# Patient Record
Sex: Female | Born: 1980 | Race: White | Hispanic: No | Marital: Single | Smoking: Former smoker
Health system: Southern US, Community
[De-identification: ages and names within clinical notes are randomized; demographics above are authoritative.]

## PROBLEM LIST (undated history)

## (undated) DIAGNOSIS — R569 Unspecified convulsions: Secondary | ICD-10-CM

## (undated) DIAGNOSIS — F419 Anxiety disorder, unspecified: Secondary | ICD-10-CM

## (undated) DIAGNOSIS — S069X9A Unspecified intracranial injury with loss of consciousness of unspecified duration, initial encounter: Secondary | ICD-10-CM

## (undated) HISTORY — PX: APPENDECTOMY: SHX54

## (undated) HISTORY — PX: ESOPHAGOGASTRODUODENOSCOPY ENDOSCOPY: SHX5814

## (undated) HISTORY — PX: CHOLECYSTECTOMY: SHX55

## (undated) HISTORY — PX: DILATION AND CURETTAGE OF UTERUS: SHX78

---

## 2006-03-07 DIAGNOSIS — S069XAA Unspecified intracranial injury with loss of consciousness status unknown, initial encounter: Secondary | ICD-10-CM

## 2006-03-07 DIAGNOSIS — S069X9A Unspecified intracranial injury with loss of consciousness of unspecified duration, initial encounter: Secondary | ICD-10-CM

## 2006-03-07 HISTORY — DX: Unspecified intracranial injury with loss of consciousness status unknown, initial encounter: S06.9XAA

## 2006-03-07 HISTORY — DX: Unspecified intracranial injury with loss of consciousness of unspecified duration, initial encounter: S06.9X9A

## 2017-02-23 DIAGNOSIS — R200 Anesthesia of skin: Secondary | ICD-10-CM

## 2017-02-23 DIAGNOSIS — G43919 Migraine, unspecified, intractable, without status migrainosus: Secondary | ICD-10-CM | POA: Diagnosis not present

## 2017-02-23 DIAGNOSIS — D72829 Elevated white blood cell count, unspecified: Secondary | ICD-10-CM | POA: Diagnosis not present

## 2017-02-23 DIAGNOSIS — R531 Weakness: Secondary | ICD-10-CM

## 2017-02-24 DIAGNOSIS — R531 Weakness: Secondary | ICD-10-CM | POA: Diagnosis not present

## 2017-02-24 DIAGNOSIS — R200 Anesthesia of skin: Secondary | ICD-10-CM | POA: Diagnosis not present

## 2017-02-24 DIAGNOSIS — D72829 Elevated white blood cell count, unspecified: Secondary | ICD-10-CM | POA: Diagnosis not present

## 2017-02-24 DIAGNOSIS — G43919 Migraine, unspecified, intractable, without status migrainosus: Secondary | ICD-10-CM | POA: Diagnosis not present

## 2017-02-24 DIAGNOSIS — R51 Headache: Secondary | ICD-10-CM | POA: Diagnosis not present

## 2017-02-25 DIAGNOSIS — R200 Anesthesia of skin: Secondary | ICD-10-CM | POA: Diagnosis not present

## 2017-02-25 DIAGNOSIS — R531 Weakness: Secondary | ICD-10-CM | POA: Diagnosis not present

## 2017-02-25 DIAGNOSIS — D72829 Elevated white blood cell count, unspecified: Secondary | ICD-10-CM | POA: Diagnosis not present

## 2017-02-25 DIAGNOSIS — G43919 Migraine, unspecified, intractable, without status migrainosus: Secondary | ICD-10-CM | POA: Diagnosis not present

## 2017-03-02 ENCOUNTER — Other Ambulatory Visit: Payer: Self-pay

## 2017-03-02 ENCOUNTER — Encounter (HOSPITAL_COMMUNITY): Payer: Self-pay | Admitting: Emergency Medicine

## 2017-03-02 ENCOUNTER — Emergency Department (HOSPITAL_COMMUNITY)
Admission: EM | Admit: 2017-03-02 | Discharge: 2017-03-02 | Disposition: A | Discharge status: Home / Self Care | Payer: Medicaid Other | Attending: Internal Medicine | Admitting: Internal Medicine

## 2017-03-02 DIAGNOSIS — R51 Headache: Secondary | ICD-10-CM | POA: Insufficient documentation

## 2017-03-02 DIAGNOSIS — G459 Transient cerebral ischemic attack, unspecified: Secondary | ICD-10-CM | POA: Diagnosis present

## 2017-03-02 DIAGNOSIS — R569 Unspecified convulsions: Secondary | ICD-10-CM | POA: Diagnosis present

## 2017-03-02 DIAGNOSIS — R42 Dizziness and giddiness: Secondary | ICD-10-CM | POA: Insufficient documentation

## 2017-03-02 DIAGNOSIS — R519 Headache, unspecified: Secondary | ICD-10-CM

## 2017-03-02 HISTORY — DX: Unspecified convulsions: R56.9

## 2017-03-02 HISTORY — DX: Unspecified intracranial injury with loss of consciousness of unspecified duration, initial encounter: S06.9X9A

## 2017-03-02 HISTORY — DX: Anxiety disorder, unspecified: F41.9

## 2017-03-02 LAB — CBC WITH DIFFERENTIAL/PLATELET
BASOS ABS: 0.1 10*3/uL (ref 0.0–0.1)
BASOS PCT: 1 %
EOS PCT: 1 %
Eosinophils Absolute: 0.2 10*3/uL (ref 0.0–0.7)
HCT: 43.1 % (ref 36.0–46.0)
Hemoglobin: 14.4 g/dL (ref 12.0–15.0)
Lymphocytes Relative: 17 %
Lymphs Abs: 3 10*3/uL (ref 0.7–4.0)
MCH: 28.2 pg (ref 26.0–34.0)
MCHC: 33.4 g/dL (ref 30.0–36.0)
MCV: 84.5 fL (ref 78.0–100.0)
MONO ABS: 0.8 10*3/uL (ref 0.1–1.0)
Monocytes Relative: 4 %
Neutro Abs: 13.8 10*3/uL — ABNORMAL HIGH (ref 1.7–7.7)
Neutrophils Relative %: 77 %
PLATELETS: 527 10*3/uL — AB (ref 150–400)
RBC: 5.1 MIL/uL (ref 3.87–5.11)
RDW: 14.7 % (ref 11.5–15.5)
WBC: 18 10*3/uL — AB (ref 4.0–10.5)

## 2017-03-02 LAB — BASIC METABOLIC PANEL
ANION GAP: 13 (ref 5–15)
BUN: 8 mg/dL (ref 6–20)
CALCIUM: 9.4 mg/dL (ref 8.9–10.3)
CO2: 24 mmol/L (ref 22–32)
Chloride: 102 mmol/L (ref 101–111)
Creatinine, Ser: 0.84 mg/dL (ref 0.44–1.00)
GFR calc Af Amer: >60 mL/min (ref >60)
GLUCOSE: 88 mg/dL (ref 65–99)
POTASSIUM: 4 mmol/L (ref 3.5–5.1)
SODIUM: 139 mmol/L (ref 135–145)

## 2017-03-02 LAB — I-STAT BETA HCG BLOOD, ED (MC, WL, AP ONLY)

## 2017-03-02 LAB — CBG MONITORING, ED: GLUCOSE-CAPILLARY: 89 mg/dL (ref 65–99)

## 2017-03-02 MED ORDER — MECLIZINE HCL 25 MG PO TABS: 25.0000 mg | ORAL_TABLET | Freq: Three times a day (TID) | ORAL | 0 refills | Status: DC | PRN

## 2017-03-02 MED ORDER — KETOROLAC TROMETHAMINE 15 MG/ML IJ SOLN
15.0000 mg | Freq: Once | INTRAMUSCULAR | Status: AC
  Administered 2017-03-02: 15 mg via INTRAVENOUS
  Filled 2017-03-02: qty 1

## 2017-03-02 MED ORDER — METOCLOPRAMIDE HCL 5 MG/ML IJ SOLN
10.0000 mg | Freq: Once | INTRAMUSCULAR | Status: AC
  Administered 2017-03-02: 10 mg via INTRAVENOUS
  Filled 2017-03-02: qty 2

## 2017-03-02 NOTE — Discharge Instructions (Signed)
You were seen in the emergency department due to concern for seizure activity at your primary care office.  We checked some basic lab work, your labs were consistent with your lab work done during your hospital stay last week.  We treated your headache and dizziness with Toradol, this is an anti-inflammatory medicine, and Reglan, this is an anti-nausea medicine.   I have prescribed you Meclizine to treat your dizziness. You may take this as needed every 8 hours.   Take Tylenol for you headache, I also recommend you apply heat and topical lidocaine patches to the right side of your neck. Ask your pharmacist where these lidocaine patches are.  I suspect that your headache may be related to some tension in the muscles on the right side of her neck.  These treatments will help to relax these muscles.  I have prescribed you meclizine for your dizziness.   Call your primary care provider office today to schedule a follow up appointment for tomorrow or Monday.  Return to the emergency department for new or worsening symptoms including, but not limited to seizure, loss of consciousness, change in your vision, numbness, weakness, or inability to move your neck.

## 2017-03-02 NOTE — ED Triage Notes (Signed)
Pt in via [facility] with possible seizures. Per EMS, pt was recently hospitalized at [facility] for seizures and TIA. Went to PCP today for check-up and had 3 "psuedoseizures" per EMS. No incontinence, no LOC, pt remained seated entire time. Arrives nauseous, denies pain. Hx of TBI, was recently started on Keppra after [facility]pt[facility] d/c

## 2017-03-02 NOTE — ED Notes (Signed)
Pt ambulated with one assist to bathroom - unsteady gait

## 2017-03-02 NOTE — ED Provider Notes (Signed)
[facility] EMERGENCY DEPARTMENT Provider Note   CSN: [id number] Arrival date & time: 03/02/17  1249     History   Chief Complaint Chief Complaint  Patient presents with  . Seizures    HPI Caitlin Glass is a 36 y.o. female with a hx of TBI, seizures, and anxiety who arrives to the emergency department via EMS from primary care office due to concern for 3 seizures vs. pseudoseizures just PTA per EMS, at present patient complaining of intermittent right-sided headache with nausea and dizziness as well as constant R sided weakness each of which have been present for the past 9 days.  Patient states that she was at her primary care office when they were asking her to sit up and she wanted to lay down, the next thing she remembers is being surrounding by office staff, will not elaborate on this more.  Per EMS to triage RN office staff said patient remained seated the entire time, no LOC or incontinence. History is not entirely clear regarding seizures vs. Pseudoseizures or what exactly occurred. Patient with recent hospital admission at [facility] for intractable right-sided headache, breakthrough seizures, right-sided weakness, and leukocytosis.  Patient was admitted 12/20-12/22. Had extensive workup including a head CT and MRI of the brain each of which which were negative for significant abnormalities.  Patient refused lumbar puncture during her admission due to "complications with an epidural in the past".  Patient was discharged home on Keppra and aspirin with neurology follow up.  States that she has been taking these as prescribed.  She reports no significant change in her symptoms since discharge from the hospital.  HPI  Past Medical History:  Diagnosis Date  . Anxiety   . Seizures ([place])   . TBI (traumatic brain injury) ([place]) 2008    There are no active problems to display for this patient.   History reviewed. No pertinent surgical history.  OB History    No  data available       Home Medications    Prior to Admission medications   Not on File    Family History History reviewed. No pertinent family history.  Social History Social History   Tobacco Use  . Smoking status: Never Smoker  . Smokeless tobacco: Never Used  Substance Use Topics  . Alcohol use: No    Frequency: Never  . Drug use: Yes    Types: Marijuana     Allergies   Patient has no allergy information on record.   Review of Systems Review of Systems  Constitutional: Negative for fever.  HENT: Negative for ear pain, hearing loss and sore throat.   Eyes: Negative for visual disturbance.  Respiratory: Negative for shortness of breath.   Cardiovascular: Negative for chest pain.  Gastrointestinal: Positive for nausea. Negative for abdominal pain, constipation, diarrhea and vomiting.  Musculoskeletal: Neck pain: R side with HA.  Neurological: Positive for dizziness (with head movements), seizures (Per EMS vs. pseudoseizures), weakness (RUE/RLE) and headaches. Negative for syncope and numbness.  All other systems reviewed and are negative.    Physical Exam Updated Vital Signs BP 128/83   Pulse 77   Resp 16   Ht 5\' 5"  (1.651 m)   Wt 117 kg (258 lb)   LMP 02/25/2017   SpO2 99%   BMI 42.93 kg/m   Physical Exam  Constitutional: She appears well-developed and well-nourished.  Non-toxic appearance. No distress.  HENT:  Head: Normocephalic and atraumatic.  Nose: Nose normal.  Mouth/Throat: Uvula  is midline and oropharynx is clear and moist.  Eyes: Conjunctivae and EOM are normal. Pupils are equal, round, and reactive to light. Right eye exhibits no discharge. Left eye exhibits no discharge.  L eye with chronic convergent strabismus.   Neck: Normal range of motion. Neck supple. Muscular tenderness (R paraspinal with palpable muscle spasm) present. No spinous process tenderness present. No neck rigidity.  Cardiovascular: Normal rate and regular rhythm.  No  murmur heard. Pulmonary/Chest: Breath sounds normal. No respiratory distress. She has no wheezes. She has no rales.  Abdominal: Soft. She exhibits no distension. There is no tenderness.  Neurological:  Alert. Clear speech. No facial droop. CNIII-XII are intact. Bilateral upper and lower extremities' sensation intact to sharp and dull touch.  RUE 4/5 grip strength, LUE 5/5 grip strength. RLE 4/5 strength with dorsi/plantar flexion, LLE 5/5 strength with dorsi/plantar flexion. Patient is able to lift bilateral legs off of the bed. Normal finger to nose bilaterally. Negative pronator drift. Patient is able to ambulate, some difficulty with R leg, however able to bear weight, unchanged since admission and work-up.   Skin: Skin is warm and dry. No rash noted.  Psychiatric: She has a normal mood and affect. Her behavior is normal.  Nursing note and vitals reviewed.  ED Treatments / Results  Labs Results for orders placed or performed during the hospital encounter of 37/16/96  Basic metabolic panel  Result Value Ref Range   Sodium 139 135 - 145 mmol/L   Potassium 4.0 3.5 - 5.1 mmol/L   Chloride 102 101 - 111 mmol/L   CO2 24 22 - 32 mmol/L   Glucose, Bld 88 65 - 99 mg/dL   BUN 8 6 - 20 mg/dL   Creatinine, Ser 0.84 0.44 - 1.00 mg/dL   Calcium 9.4 8.9 - 10.3 mg/dL   GFR calc non Af Amer >60 >60 mL/min   GFR calc Af Amer >60 >60 mL/min   Anion gap 13 5 - 15  CBC WITH DIFFERENTIAL  Result Value Ref Range   WBC 18.0 (H) 4.0 - 10.5 K/uL   RBC 5.10 3.87 - 5.11 MIL/uL   Hemoglobin 14.4 12.0 - 15.0 g/dL   HCT 43.1 36.0 - 46.0 %   MCV 84.5 78.0 - 100.0 fL   MCH 28.2 26.0 - 34.0 pg   MCHC 33.4 30.0 - 36.0 g/dL   RDW 14.7 11.5 - 15.5 %   Platelets 527 (H) 150 - 400 K/uL   Neutrophils Relative % 77 %   Neutro Abs 13.8 (H) 1.7 - 7.7 K/uL   Lymphocytes Relative 17 %   Lymphs Abs 3.0 0.7 - 4.0 K/uL   Monocytes Relative 4 %   Monocytes Absolute 0.8 0.1 - 1.0 K/uL   Eosinophils Relative 1 %    Eosinophils Absolute 0.2 0.0 - 0.7 K/uL   Basophils Relative 1 %   Basophils Absolute 0.1 0.0 - 0.1 K/uL  CBG monitoring, ED  Result Value Ref Range   Glucose-Capillary 89 65 - 99 mg/dL  I-Stat beta hCG blood, ED  Result Value Ref Range   I-stat hCG, quantitative <5.0 <5 mIU/mL   Comment 3           No results found.  EKG  EKG Interpretation  Date/Time:  Thursday March 02 2017 12:50:01 EST Ventricular Rate:  77 PR Interval:    QRS Duration: 115 QT Interval:  391 QTC Calculation: 443 R Axis:   -32 Text Interpretation:  Sinus rhythm Probable left atrial enlargement  Nonspecific intraventricular conduction delay Baseline wander in lead(s) III aVL No old tracing to compare Confirmed by Daleen Bo [id number]) on 03/02/2017 1:17:54 PM      Radiology No results found.  Procedures Procedures (including critical care time)  Medications Ordered in ED Medications  ketorolac (TORADOL) 15 MG/ML injection 15 mg (15 mg Intravenous Given 03/02/17 1453)  metoCLOPramide (REGLAN) injection 10 mg (10 mg Intravenous Given 03/02/17 1453)   Initial Impression / Assessment and Plan / ED Course  I have reviewed the triage vital signs and the nursing notes.  Pertinent labs & imaging results that were available during my care of the patient were reviewed by me and considered in my medical decision making (see chart for details).  Patient presents with possible seizure activity just prior to arrival.  History regarding events leading to patient coming into the emergency department is somewhat unclear.  At present patient complaining of intermittent right-sided headache with nausea and dizziness as well as constant R sided weakness- these complaints have all been present since 12/18. Patient with admission and stay at [facility] from 12/20-12/22- records brought by patient and reviewed by me. Patient with extensive work-up including MRI of the brain and CT head, no significant abnormality, refused LP.   Leukocytosis noted during admission appears chronic for patient.  Was discharged home on Keppra and aspirin with neurology follow-up.  Patient is nontoxic-appearing, in no apparent distress, with stable vital signs.  On exam she does have very mild right-sided weakness to upper and lower extremities, upon review of previous notes from [place] this appears unchanged.  She otherwise has no neurologic deficits.  Of note palpable muscle spasm to right neck paraspinal muscles.  Will give Toradol and Compazine for headache and nausea/dizziness. Will evaluate with EKG and screening labs.  Grossly unremarkable other than WBC count of 18.0- appears chronic based on lab review.  She is afebrile, nonseptic appearing, with no clear infectious etiology. There is no nuchal rigidity, patient is alert and oriented, doubt meningitis.   Upon re-evaluaiton patient feeling improved states she is ready to go home. Patient is hemodynamically stable, at mental baseline, with only neurologic finding being R sided weakness on exam which is not new since imaging.  Patient is advised to followup with neurology as scheduled. Spoke with patient about driving restrictions until cleared by a neurologist.  Will prescribe Meclizine for dizziness in the interim, recommended heat, tylenol, and lidoderm patches for headache/neck muscle pain.   I discussed results, treatment plan, need for neurology follow-up, and return precautions with the patient. Provided opportunity for questions, patient confirmed understanding and is in agreement with plan.    Final Clinical Impressions(s) / ED Diagnoses   Final diagnoses:  Nonintractable headache, unspecified chronicity pattern, unspecified headache type    ED Discharge Orders        Ordered    meclizine (ANTIVERT) 25 MG tablet  3 times daily PRN     03/02/17 1602       Amaryllis Dyke, PA-C 03/02/17 1641    Daleen Bo, MD 03/02/17 1657

## 2017-03-02 NOTE — ED Notes (Signed)
Pt's CBG 89.  Informed Lovena Le, RN.

## 2017-03-03 ENCOUNTER — Encounter: Payer: Self-pay | Admitting: Neurology

## 2017-03-03 LAB — LEVETIRACETAM LEVEL: LEVETIRACETAM: 6.8 ug/mL — AB (ref 10.0–40.0)

## 2017-03-06 ENCOUNTER — Inpatient Hospital Stay (HOSPITAL_COMMUNITY)
Admission: EM | Admit: 2017-03-06 | Discharge: 2017-03-13 | DRG: 880 | Disposition: A | Discharge status: Home Health | Payer: Medicaid Other | Attending: Family Medicine | Admitting: Family Medicine

## 2017-03-06 ENCOUNTER — Encounter: Payer: Self-pay | Admitting: Neurology

## 2017-03-06 ENCOUNTER — Emergency Department (HOSPITAL_COMMUNITY): Payer: Medicaid Other

## 2017-03-06 ENCOUNTER — Encounter (HOSPITAL_COMMUNITY): Payer: Self-pay

## 2017-03-06 ENCOUNTER — Ambulatory Visit: Payer: Medicaid Other | Admitting: Neurology

## 2017-03-06 ENCOUNTER — Observation Stay (HOSPITAL_COMMUNITY): Payer: Medicaid Other

## 2017-03-06 VITALS — BP 134/78 | HR 110 | Ht 65.0 in | Wt 259.0 lb

## 2017-03-06 DIAGNOSIS — Z87820 Personal history of traumatic brain injury: Secondary | ICD-10-CM

## 2017-03-06 DIAGNOSIS — R51 Headache: Secondary | ICD-10-CM | POA: Diagnosis not present

## 2017-03-06 DIAGNOSIS — Z79899 Other long term (current) drug therapy: Secondary | ICD-10-CM

## 2017-03-06 DIAGNOSIS — D72829 Elevated white blood cell count, unspecified: Secondary | ICD-10-CM

## 2017-03-06 DIAGNOSIS — R202 Paresthesia of skin: Secondary | ICD-10-CM

## 2017-03-06 DIAGNOSIS — R200 Anesthesia of skin: Secondary | ICD-10-CM | POA: Diagnosis not present

## 2017-03-06 DIAGNOSIS — H8110 Benign paroxysmal vertigo, unspecified ear: Secondary | ICD-10-CM | POA: Diagnosis present

## 2017-03-06 DIAGNOSIS — F411 Generalized anxiety disorder: Principal | ICD-10-CM | POA: Diagnosis present

## 2017-03-06 DIAGNOSIS — R251 Tremor, unspecified: Secondary | ICD-10-CM | POA: Diagnosis present

## 2017-03-06 DIAGNOSIS — R569 Unspecified convulsions: Secondary | ICD-10-CM | POA: Diagnosis present

## 2017-03-06 DIAGNOSIS — Z8673 Personal history of transient ischemic attack (TIA), and cerebral infarction without residual deficits: Secondary | ICD-10-CM

## 2017-03-06 DIAGNOSIS — F319 Bipolar disorder, unspecified: Secondary | ICD-10-CM | POA: Diagnosis present

## 2017-03-06 DIAGNOSIS — Z9049 Acquired absence of other specified parts of digestive tract: Secondary | ICD-10-CM

## 2017-03-06 DIAGNOSIS — H9311 Tinnitus, right ear: Secondary | ICD-10-CM | POA: Diagnosis present

## 2017-03-06 DIAGNOSIS — G459 Transient cerebral ischemic attack, unspecified: Secondary | ICD-10-CM | POA: Diagnosis present

## 2017-03-06 DIAGNOSIS — Z7982 Long term (current) use of aspirin: Secondary | ICD-10-CM

## 2017-03-06 DIAGNOSIS — R519 Headache, unspecified: Secondary | ICD-10-CM

## 2017-03-06 DIAGNOSIS — Z820 Family history of epilepsy and other diseases of the nervous system: Secondary | ICD-10-CM

## 2017-03-06 LAB — BASIC METABOLIC PANEL
Anion gap: 9 (ref 5–15)
BUN: 8 mg/dL (ref 6–20)
CALCIUM: 9.3 mg/dL (ref 8.9–10.3)
CO2: 25 mmol/L (ref 22–32)
CREATININE: 0.79 mg/dL (ref 0.44–1.00)
Chloride: 104 mmol/L (ref 101–111)
Glucose, Bld: 90 mg/dL (ref 65–99)
Potassium: 3.9 mmol/L (ref 3.5–5.1)
SODIUM: 138 mmol/L (ref 135–145)

## 2017-03-06 LAB — CBC
HCT: 43.8 % (ref 36.0–46.0)
Hemoglobin: 14.7 g/dL (ref 12.0–15.0)
MCH: 28 pg (ref 26.0–34.0)
MCHC: 33.6 g/dL (ref 30.0–36.0)
MCV: 83.4 fL (ref 78.0–100.0)
PLATELETS: 498 10*3/uL — AB (ref 150–400)
RBC: 5.25 MIL/uL — AB (ref 3.87–5.11)
RDW: 14.7 % (ref 11.5–15.5)
WBC: 16.3 10*3/uL — AB (ref 4.0–10.5)

## 2017-03-06 LAB — I-STAT CHEM 8, ED
BUN: 9 mg/dL (ref 6–20)
CALCIUM ION: 1.14 mmol/L — AB (ref 1.15–1.40)
CHLORIDE: 102 mmol/L (ref 101–111)
Creatinine, Ser: 0.7 mg/dL (ref 0.44–1.00)
GLUCOSE: 89 mg/dL (ref 65–99)
HCT: 45 % (ref 36.0–46.0)
HEMOGLOBIN: 15.3 g/dL — AB (ref 12.0–15.0)
Potassium: 3.9 mmol/L (ref 3.5–5.1)
SODIUM: 139 mmol/L (ref 135–145)
TCO2: 25 mmol/L (ref 22–32)

## 2017-03-06 LAB — I-STAT BETA HCG BLOOD, ED (MC, WL, AP ONLY)

## 2017-03-06 MED ORDER — SENNOSIDES-DOCUSATE SODIUM 8.6-50 MG PO TABS: 1.0000 | ORAL_TABLET | Freq: Every evening | ORAL | Status: DC | PRN

## 2017-03-06 MED ORDER — PROCHLORPERAZINE EDISYLATE 5 MG/ML IJ SOLN
10.0000 mg | Freq: Once | INTRAMUSCULAR | Status: AC
  Administered 2017-03-06: 10 mg via INTRAVENOUS
  Filled 2017-03-06: qty 2

## 2017-03-06 MED ORDER — ENOXAPARIN SODIUM 40 MG/0.4ML ~~LOC~~ SOLN
40.0000 mg | SUBCUTANEOUS | Status: DC
Start: 2017-03-06 — End: 2017-03-13
  Administered 2017-03-06 – 2017-03-12 (×7): 40 mg via SUBCUTANEOUS
  Filled 2017-03-06 (×8): qty 0.4

## 2017-03-06 MED ORDER — STROKE: EARLY STAGES OF RECOVERY BOOK
Freq: Once | Status: AC
  Administered 2017-03-06: 20:00:00
  Filled 2017-03-06: qty 1

## 2017-03-06 MED ORDER — ACETAMINOPHEN 325 MG PO TABS
650.0000 mg | ORAL_TABLET | ORAL | Status: DC | PRN
  Administered 2017-03-07 – 2017-03-11 (×4): 650 mg via ORAL
  Filled 2017-03-06 (×4): qty 2

## 2017-03-06 MED ORDER — LEVETIRACETAM 500 MG PO TABS: 500.0000 mg | ORAL_TABLET | Freq: Two times a day (BID) | ORAL | Status: DC

## 2017-03-06 MED ORDER — ACETAMINOPHEN 650 MG RE SUPP: 650.0000 mg | RECTAL | Status: DC | PRN

## 2017-03-06 MED ORDER — ACETAMINOPHEN 160 MG/5ML PO SOLN: 650.0000 mg | ORAL | Status: DC | PRN

## 2017-03-06 MED ORDER — LORAZEPAM 2 MG/ML IJ SOLN
1.0000 mg | Freq: Once | INTRAMUSCULAR | Status: DC
Start: 2017-03-06 — End: 2017-03-09
  Filled 2017-03-06: qty 1

## 2017-03-06 MED ORDER — IOPAMIDOL (ISOVUE-370) INJECTION 76%
INTRAVENOUS | Status: AC
  Administered 2017-03-06: 50 mL
  Filled 2017-03-06: qty 50

## 2017-03-06 MED ORDER — LEVETIRACETAM 500 MG PO TABS
500.0000 mg | ORAL_TABLET | Freq: Two times a day (BID) | ORAL | Status: DC
  Administered 2017-03-06 – 2017-03-09 (×7): 500 mg via ORAL
  Filled 2017-03-06 (×7): qty 1

## 2017-03-06 MED ORDER — GADOBENATE DIMEGLUMINE 529 MG/ML IV SOLN
20.0000 mL | Freq: Once | INTRAVENOUS | Status: AC
  Administered 2017-03-06: 20 mL via INTRAVENOUS

## 2017-03-06 MED ORDER — DIPHENHYDRAMINE HCL 50 MG/ML IJ SOLN
25.0000 mg | Freq: Once | INTRAMUSCULAR | Status: AC
  Administered 2017-03-06: 25 mg via INTRAVENOUS
  Filled 2017-03-06: qty 1

## 2017-03-06 MED ORDER — KETOROLAC TROMETHAMINE 30 MG/ML IJ SOLN
30.0000 mg | Freq: Once | INTRAMUSCULAR | Status: AC
  Administered 2017-03-06: 30 mg via INTRAVENOUS
  Filled 2017-03-06: qty 1

## 2017-03-06 MED ORDER — ASPIRIN 325 MG PO TABS: 325.0000 mg | ORAL_TABLET | Freq: Every day | ORAL | Status: AC

## 2017-03-06 NOTE — ED Notes (Signed)
Patient unable to urinate at this time.  Pt used bathroom prior to coming to [facility].

## 2017-03-06 NOTE — ED Triage Notes (Signed)
Pt brought in by EMS from [place] office due to having right sided numbness and neck/jaw pain. Pt has hx of TBI. Per EMS, pt had positive BC from doctor's office. VSS.

## 2017-03-06 NOTE — ED Notes (Signed)
Attempted to draw blood x2

## 2017-03-06 NOTE — ED Provider Notes (Signed)
[facility] EMERGENCY DEPARTMENT Provider Note   CSN: [id number] Arrival date & time: 03/06/17  1223     History   Chief Complaint Chief Complaint  Patient presents with  . Numbness    HPI Caitlin Glass is a 36 y.o. female.  36yo F w/ PMH including TBI, seizures who p/w headaches and facial numbness.  On 02/23/17, she began having shocklike headaches that started in the back of her head and moved to the right side of her head lasting a few seconds.  She has had associated right facial numbness.  She was evaluated at outside hospital where MRI, MRA brain and carotid dopplers were negative, concern for possible TIA and d/c'd on aspirin and keppra.  She has continued to have headaches that have become worse recently and reports ongoing problems with nausea and vomiting since the summer.  She reports her vision looks "bright." She reports history of Crohn's disease but is not currently medicated and does not follow with a specialist for it.  She states that she feels like she is trapped in the right side of her body.  She was seen in the outpatient neurology clinic today and was sent here for further testing.  She denies any fevers, cough/cold symptoms.  No recent stressors at home.   The history is provided by medical records and the patient.    Past Medical History:  Diagnosis Date  . Anxiety   . Seizures ([place])   . TBI (traumatic brain injury) [facility]) 2008    Patient Active Problem List   Diagnosis Date Noted  . TIA (transient ischemic attack) 03/06/2017    Past Surgical History:  Procedure Laterality Date  . APPENDECTOMY    . CHOLECYSTECTOMY    . DILATION AND CURETTAGE OF UTERUS    . ESOPHAGOGASTRODUODENOSCOPY ENDOSCOPY      OB History    No data available       Home Medications    Prior to Admission medications   Medication Sig Start Date End Date Taking? Authorizing Provider  amoxicillin (AMOXIL) 875 MG tablet Take 875 mg by mouth 2 (two)  times daily.    [provider]  aspirin 325 MG tablet Take 325 mg by mouth daily.    [provider]  levETIRAcetam (KEPPRA) 500 MG tablet Take 500 mg by mouth 2 (two) times daily.    [provider]  meclizine (ANTIVERT) 25 MG tablet Take 1 tablet (25 mg total) by mouth 3 (three) times daily as needed for dizziness. 03/02/17   Petrucelli, Glynda Jaeger, PA-C    Family History Family History  Problem Relation Age of Onset  . Seizures Mother   . Stroke Father     Social History Social History   Tobacco Use  . Smoking status: Never Smoker  . Smokeless tobacco: Never Used  Substance Use Topics  . Alcohol use: No    Frequency: Never  . Drug use: Yes    Types: Marijuana     Allergies   Lidocaine and Tramadol   Review of Systems Review of Systems All other systems reviewed and are negative except that which was mentioned in HPI   Physical Exam Updated Vital Signs BP 131/80   Pulse 84   Temp 99 F (37.2 C) (Oral)   Resp 15   Ht 5\' 5"  (1.651 m)   Wt 117.5 kg (259 lb)   LMP 02/25/2017   SpO2 100%   BMI 43.10 kg/m   Physical Exam  Constitutional: She is oriented to person, place, and time. She appears well-developed and well-nourished. No distress.  Awake, alert  HENT:  Head: Normocephalic and atraumatic.  Eyes: Conjunctivae and EOM are normal. Pupils are equal, round, and reactive to light.  Esotropia L eye  Neck: Neck supple.  Cardiovascular: Normal rate, regular rhythm and normal heart sounds.  No murmur heard. Pulmonary/Chest: Effort normal and breath sounds normal. No respiratory distress.  Abdominal: Soft. Bowel sounds are normal. She exhibits no distension. There is no tenderness.  Musculoskeletal: She exhibits no edema.  Neurological: She is alert and oriented to person, place, and time. She has normal reflexes. No cranial nerve deficit. She exhibits normal muscle tone.  Fluent speech, normal finger-to-nose testing, negative  pronator drift, no clonus 5/5 strength x all 4 extremities Endorses decreased sensation R face that stops at midline  Skin: Skin is warm and dry.  Psychiatric:  Bizarre affect  Nursing note and vitals reviewed.    ED Treatments / Results  Labs (all labs ordered are listed, but only abnormal results are displayed) Labs Reviewed  CBC - Abnormal; Notable for the following components:      Result Value   WBC 16.3 (*)    RBC 5.25 (*)    Platelets 498 (*)    All other components within normal limits  I-STAT CHEM 8, ED - Abnormal; Notable for the following components:   Calcium, Ion 1.14 (*)    Hemoglobin 15.3 (*)    All other components within normal limits  BASIC METABOLIC PANEL  I-STAT BETA HCG BLOOD, ED ([facility], [facility], [facility] ONLY)    EKG  EKG Interpretation None       Radiology No results found.  Procedures Procedures (including critical care time)  Medications Ordered in ED Medications  levETIRAcetam (KEPPRA) tablet 500 mg (500 mg Oral Given 03/06/17 1546)  diphenhydrAMINE (BENADRYL) injection 25 mg (25 mg Intravenous Given 03/06/17 1546)  prochlorperazine (COMPAZINE) injection 10 mg (10 mg Intravenous Given 03/06/17 1552)  ketorolac (TORADOL) 30 MG/ML injection 30 mg (30 mg Intravenous Given 03/06/17 1545)  iopamidol (ISOVUE-370) 76 % injection (50 mLs  Contrast Given 03/06/17 1520)     Initial Impression / Assessment and Plan / ED Course  I have reviewed the triage vital signs and the nursing notes.  Pertinent labs & imaging results that were available during my care of the patient were reviewed by me and considered in my medical decision making (see chart for details).     Pt sent here from neuro clinic for further eval w/ ongoing headaches, R facial numbness. Aside from R facial paresthesias, otherwise neuro intact. Discussed w/ Dr. Lorraine Lax, who evaluated pt and had discussed w/ Dr. Delice Lesch.  He has recommended admission for workup including MRV/MRI brain, CTA head and  neck.  If these studies are negative, she may require fluoroscopic guided LP to evaluate opening pressure.  I have discussed admission with [facility] hospitalist and pt admitted for further work up.   Final Clinical Impressions(s) / ED Diagnoses   Final diagnoses:  None    ED Discharge Orders    None       Auriella Wieand, Wenda Overland, MD 03/06/17 [phone]

## 2017-03-06 NOTE — ED Notes (Signed)
The pt was transported to MRI without RN informing so she was un medicated prior to leaving the department.
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
## 2017-03-06 NOTE — H&P (Signed)
History and Physical  Caitlin Glass ZDG:[id number] DOB: May 02, 1980 DOA: 03/06/2017  Referring physician: ED PCP: [facility]  Outpatient Specialists: Neurology Patient coming from: Home & is able to ambulate independently  Chief Complaint: Multiple neurologic complains including numbness on R side of face, R sided headache  HPI: Caitlin Glass is a 36 y.o. female with medical history significant for TBI, anxiety, seizures who was sent from her neurology clinic due to multiple neurologic complaints including R sided parietal headache,shock-like in character, R face numbness. Pt states that she is trapped in the R side of her body. Pt reports some ongoing nausea, dizziness especially from a seated position, but denies any vomiting. Pt denies any chest pain, SOB, abdominal pain, D/C, fever/chills, blurry vision, no focal neurologic deficit. Of note, pt was recently admitted in [facility] in 12/20 for which extensive work up was done, all of which were negative. Pt was d/c on ASA, keppra. Of note, pt had a follow up appt in neurology today (see note for full details) and was sent to the ED for further evaluation and admission for further management   ED Course: Pt had a repeat MRI, CT angio head/neck, MRV all noted to be negative. Neurolgy consulted  Review of Systems: Review of systems are otherwise negative   Past Medical History:  Diagnosis Date  . Anxiety   . Seizures ([place])   . TBI (traumatic brain injury) ([place]) 2008   Past Surgical History:  Procedure Laterality Date  . APPENDECTOMY    . CHOLECYSTECTOMY    . DILATION AND CURETTAGE OF UTERUS    . ESOPHAGOGASTRODUODENOSCOPY ENDOSCOPY      Social History:  reports that  has never smoked. she has never used smokeless tobacco. She reports that she uses drugs. Drug: Marijuana. She reports that she does not drink alcohol.   Allergies  Allergen Reactions  . Lidocaine Hives  . Tramadol     seizures      Family History  Problem Relation Age of Onset  . Seizures Mother   . Stroke Father       Prior to Admission medications   Medication Sig Start Date End Date Taking? Authorizing Provider  acetaminophen (TYLENOL) 325 MG tablet Take 650-875 mg by mouth every 6 (six) hours as needed for mild pain.   Yes [provider]  amoxicillin (AMOXIL) 875 MG tablet Take 875 mg by mouth 2 (two) times daily. x 10 days   Yes [provider]  aspirin 325 MG tablet Take 325 mg by mouth daily.   Yes [provider]  levETIRAcetam (KEPPRA) 500 MG tablet Take 500 mg by mouth 2 (two) times daily.   Yes [provider]  meclizine (ANTIVERT) 25 MG tablet Take 1 tablet (25 mg total) by mouth 3 (three) times daily as needed for dizziness. 03/02/17  Yes Petrucelli, Glynda Jaeger, PA-C    Physical Exam: BP 120/70   Pulse 70   Temp 98.2 F (36.8 C) (Oral)   Resp 16   Ht 5\' 5"  (1.651 m)   Wt 117.5 kg (259 lb)   LMP 02/25/2017   SpO2 100%   BMI 43.10 kg/m   General:  Alert, awake, oriented X 3, NAD Eyes: Normal ENT: Normal  Neck: Normal  Cardiovascular: S1-S2 present, no added hrt sound Respiratory: Chest clear bilaterally  Abdomen: Soft, non-tender, non-distended, BS present Skin: Normal Musculoskeletal: No pedal edema bilaterally  Psychiatric: Normal mood  Neurologic: No focal neurologic deficit. Normal  strength in all extremities          Labs on Admission:  Basic Metabolic Panel: Recent Labs  Lab 03/02/17 1406 03/06/17 1358 03/06/17 1448  NA 139 138 139  K 4.0 3.9 3.9  CL 102 104 102  CO2 24 25  --   GLUCOSE 88 90 89  BUN 8 8 9   CREATININE 0.84 0.79 0.70  CALCIUM 9.4 9.3  --    Liver Function Tests: No results for input(s): AST, ALT, ALKPHOS, BILITOT, PROT, ALBUMIN in the last 168 hours. No results for input(s): LIPASE, AMYLASE in the last 168 hours. No results for input(s): AMMONIA in the last 168 hours. CBC: Recent Labs  Lab 03/02/17 1406  03/06/17 1358 03/06/17 1448  WBC 18.0* 16.3*  --   NEUTROABS 13.8*  --   --   HGB 14.4 14.7 15.3*  HCT 43.1 43.8 45.0  MCV 84.5 83.4  --   PLT 527* 498*  --    Cardiac Enzymes: No results for input(s): CKTOTAL, CKMB, CKMBINDEX, TROPONINI in the last 168 hours.  BNP (last 3 results) No results for input(s): BNP in the last 8760 hours.  ProBNP (last 3 results) No results for input(s): PROBNP in the last 8760 hours.  CBG: Recent Labs  Lab 03/02/17 1536  GLUCAP 89    Radiological Exams on Admission: Ct Angio Head W Or Wo Contrast  Result Date: 03/06/2017 CLINICAL DATA:  Initial evaluation for acute severe right-sided headache. EXAM: CT ANGIOGRAPHY HEAD AND NECK TECHNIQUE: Multidetector CT imaging of the head and neck was performed using the standard protocol during bolus administration of intravenous contrast. Multiplanar CT image reconstructions and MIPs were obtained to evaluate the vascular anatomy. Carotid stenosis measurements (when applicable) are obtained utilizing NASCET criteria, using the distal internal carotid diameter as the denominator. CONTRAST:  60mL ISOVUE-370 IOPAMIDOL (ISOVUE-370) INJECTION 76% COMPARISON:  None. FINDINGS: CT HEAD FINDINGS Brain: Cerebral volume within normal limits for patient age. No evidence for acute intracranial hemorrhage. No findings to suggest acute large vessel territory infarct. No mass lesion, midline shift, or mass effect. Ventricles are normal in size without evidence for hydrocephalus. No extra-axial fluid collection identified. Vascular: No hyperdense vessel identified. Skull: Scalp soft tissues demonstrate no acute abnormality.Calvarium intact. Sinuses/Orbits: Globes and orbital soft tissues are within normal limits. Visualized paranasal sinuses are clear. No mastoid effusion. CTA NECK FINDINGS Aortic arch: Visualized aortic arch of normal caliber with normal branch pattern. No film rings as great vessels. Visualized subclavian arteries  widely patent. Right carotid system: Right common and internal carotid arteries widely patent without stenosis, dissection, or occlusion. Left carotid system: Left common and internal carotid arteries widely patent without stenosis, dissection, or occlusion. Vertebral arteries: Both of the vertebral artery is arise from the subclavian arteries. Vertebral arteries widely patent without stenosis, dissection, or occlusion. Skeleton: No acute osseous abnormality. No worrisome lytic or blastic osseous lesions. Other neck: No acute soft tissue abnormality within the neck. Salivary glands normal. Thyroid normal. No adenopathy. Upper chest: Visualized upper chest within normal limits. Partially visualized lungs are clear. Review of the MIP images confirms the above findings CTA HEAD FINDINGS Anterior circulation: Internal carotid artery is widely patent to the termini without flow-limiting stenosis. A1 segments widely patent. Right A1 segment fenestrated distally. Normal anterior communicating artery. Anterior cerebral artery is widely patent to their distal aspects without stenosis. M1 segments widely patent bilaterally. Normal MCA bifurcations. No proximal M2 occlusion. Distal MCA branches well perfused and symmetric. Posterior circulation:  Vertebral artery widely patent to the vertebrobasilar junction. Right vertebral artery slightly dominant. Patent left PICA. Right PICA not visualized. Dominant right AICA. Basilar artery widely patent to its distal aspect. Superior cerebral arteries patent bilaterally. Both of the posterior cerebral arteries primarily supplied via the basilar and are well perfused to their distal aspects without stenosis. Small left posterior communicating artery noted. Venous sinuses: Not well evaluated due to arterial timing of the contrast bolus. Anatomic variants: None significant. No aneurysm or vascular malformation. Delayed phase: No abnormal enhancement. Review of the MIP images confirms the  above findings IMPRESSION: Normal CTA of the head and neck. No acute intracranial abnormality identified. Electronically Signed   By: Jeannine Boga M.D.   On: 03/06/2017 17:30   Ct Angio Neck W Or Wo Contrast  Result Date: 03/06/2017 CLINICAL DATA:  Initial evaluation for acute severe right-sided headache. EXAM: CT ANGIOGRAPHY HEAD AND NECK TECHNIQUE: Multidetector CT imaging of the head and neck was performed using the standard protocol during bolus administration of intravenous contrast. Multiplanar CT image reconstructions and MIPs were obtained to evaluate the vascular anatomy. Carotid stenosis measurements (when applicable) are obtained utilizing NASCET criteria, using the distal internal carotid diameter as the denominator. CONTRAST:  42mL ISOVUE-370 IOPAMIDOL (ISOVUE-370) INJECTION 76% COMPARISON:  None. FINDINGS: CT HEAD FINDINGS Brain: Cerebral volume within normal limits for patient age. No evidence for acute intracranial hemorrhage. No findings to suggest acute large vessel territory infarct. No mass lesion, midline shift, or mass effect. Ventricles are normal in size without evidence for hydrocephalus. No extra-axial fluid collection identified. Vascular: No hyperdense vessel identified. Skull: Scalp soft tissues demonstrate no acute abnormality.Calvarium intact. Sinuses/Orbits: Globes and orbital soft tissues are within normal limits. Visualized paranasal sinuses are clear. No mastoid effusion. CTA NECK FINDINGS Aortic arch: Visualized aortic arch of normal caliber with normal branch pattern. No film rings as great vessels. Visualized subclavian arteries widely patent. Right carotid system: Right common and internal carotid arteries widely patent without stenosis, dissection, or occlusion. Left carotid system: Left common and internal carotid arteries widely patent without stenosis, dissection, or occlusion. Vertebral arteries: Both of the vertebral artery is arise from the subclavian  arteries. Vertebral arteries widely patent without stenosis, dissection, or occlusion. Skeleton: No acute osseous abnormality. No worrisome lytic or blastic osseous lesions. Other neck: No acute soft tissue abnormality within the neck. Salivary glands normal. Thyroid normal. No adenopathy. Upper chest: Visualized upper chest within normal limits. Partially visualized lungs are clear. Review of the MIP images confirms the above findings CTA HEAD FINDINGS Anterior circulation: Internal carotid artery is widely patent to the termini without flow-limiting stenosis. A1 segments widely patent. Right A1 segment fenestrated distally. Normal anterior communicating artery. Anterior cerebral artery is widely patent to their distal aspects without stenosis. M1 segments widely patent bilaterally. Normal MCA bifurcations. No proximal M2 occlusion. Distal MCA branches well perfused and symmetric. Posterior circulation: Vertebral artery widely patent to the vertebrobasilar junction. Right vertebral artery slightly dominant. Patent left PICA. Right PICA not visualized. Dominant right AICA. Basilar artery widely patent to its distal aspect. Superior cerebral arteries patent bilaterally. Both of the posterior cerebral arteries primarily supplied via the basilar and are well perfused to their distal aspects without stenosis. Small left posterior communicating artery noted. Venous sinuses: Not well evaluated due to arterial timing of the contrast bolus. Anatomic variants: None significant. No aneurysm or vascular malformation. Delayed phase: No abnormal enhancement. Review of the MIP images confirms the above findings IMPRESSION: Normal CTA of  the head and neck. No acute intracranial abnormality identified. Electronically Signed   By: Jeannine Boga M.D.   On: 03/06/2017 17:30   Mr Jeri Cos BZ Contrast  Result Date: 03/06/2017 CLINICAL DATA:  Initial evaluation for acute headache, facial numbness. EXAM: MRI HEAD WITHOUT AND  WITH CONTRAST MRV HEAD WITHOUT CONTRAST TECHNIQUE: Multiplanar, multiecho pulse sequences of the brain and surrounding structures were obtained without and with intravenous contrast. Angiographic images of the intracranial venous structures were obtained using MRV technique without intravenous contrast. CONTRAST:  36mL MULTIHANCE GADOBENATE DIMEGLUMINE 529 MG/ML IV SOLN COMPARISON:  None. FINDINGS: Brain: Cerebral volume within normal limits for patient age. No focal parenchymal signal abnormality identified. No abnormal foci of restricted diffusion to suggest acute or subacute ischemia. Gray-white matter differentiation well maintained. No encephalomalacia to suggest chronic infarction. No foci of susceptibility artifact to suggest acute or chronic intracranial hemorrhage. No mass lesion, midline shift or mass effect. No hydrocephalus. No extra-axial fluid collection. Major dural sinuses are grossly patent. Pituitary gland and suprasellar region are normal. Midline structures intact and normal. No abnormal enhancement. Vascular: Major intracranial vascular flow voids well maintained and normal in appearance. Skull and upper cervical spine: Craniocervical junction normal. Visualized upper cervical spine within normal limits. Bone marrow signal intensity normal. No scalp soft tissue abnormality. Sinuses/Orbits: Globes and orbital soft tissues within normal limits. Paranasal sinuses are clear. No mastoid effusion. Inner ear structures normal. Other: Dedicated MRV images were performed. Superior sagittal sinus patent to the level of the torcula. Torcula itself is patent. Transverse and sigmoid sinuses are patent bilaterally as are the proximal internal jugular veins. Straight sinus, vein of Galen common internal cerebral veins patent. No evidence for dural sinus thrombosis. IMPRESSION: 1. Normal brain MRI.  No acute intracranial abnormality. 2. Negative MRV of the head. No evidence for dural sinus thrombosis.  Electronically Signed   By: Jeannine Boga M.D.   On: 03/06/2017 18:46   Mr Annell Greening Head  Result Date: 03/06/2017 CLINICAL DATA:  Initial evaluation for acute headache, facial numbness. EXAM: MRI HEAD WITHOUT AND WITH CONTRAST MRV HEAD WITHOUT CONTRAST TECHNIQUE: Multiplanar, multiecho pulse sequences of the brain and surrounding structures were obtained without and with intravenous contrast. Angiographic images of the intracranial venous structures were obtained using MRV technique without intravenous contrast. CONTRAST:  38mL MULTIHANCE GADOBENATE DIMEGLUMINE 529 MG/ML IV SOLN COMPARISON:  None. FINDINGS: Brain: Cerebral volume within normal limits for patient age. No focal parenchymal signal abnormality identified. No abnormal foci of restricted diffusion to suggest acute or subacute ischemia. Gray-white matter differentiation well maintained. No encephalomalacia to suggest chronic infarction. No foci of susceptibility artifact to suggest acute or chronic intracranial hemorrhage. No mass lesion, midline shift or mass effect. No hydrocephalus. No extra-axial fluid collection. Major dural sinuses are grossly patent. Pituitary gland and suprasellar region are normal. Midline structures intact and normal. No abnormal enhancement. Vascular: Major intracranial vascular flow voids well maintained and normal in appearance. Skull and upper cervical spine: Craniocervical junction normal. Visualized upper cervical spine within normal limits. Bone marrow signal intensity normal. No scalp soft tissue abnormality. Sinuses/Orbits: Globes and orbital soft tissues within normal limits. Paranasal sinuses are clear. No mastoid effusion. Inner ear structures normal. Other: Dedicated MRV images were performed. Superior sagittal sinus patent to the level of the torcula. Torcula itself is patent. Transverse and sigmoid sinuses are patent bilaterally as are the proximal internal jugular veins. Straight sinus, vein of Galen  common internal cerebral veins patent. No evidence for dural  sinus thrombosis. IMPRESSION: 1. Normal brain MRI.  No acute intracranial abnormality. 2. Negative MRV of the head. No evidence for dural sinus thrombosis. Electronically Signed   By: Jeannine Boga M.D.   On: 03/06/2017 18:46    Assessment/Plan Present on Admission: . Numbness  Active Problems:   Numbness  #R sided H/A, facial numbness Likely due to ??complicated migraine Vs TIA, R/O CVA Extensive work up in [place], was discharged on ASA, keppra In the ED, repeat MRI, CT angio neck/head, MRV all negative ECHO pending Neurology on board: rec possible LP if all work up negative Continue ASA, keppra PT/OT eval Neuro checks, telemetry  #Leukocytosis Unknown etiology, afebrile No source of infection seen Daily cbc  DVT prophylaxis: Lovenox  Code Status: Full   Family Communication: None at bedside   Disposition Plan: Home once stable  Consults called: Neurology   Admission status: Observation    Alma Friendly MD [facility] Pager [phone]  If 7PM-7AM, please contact night-coverage [email or web address] Password [facility]  03/06/2017, 10:53 PM

## 2017-03-06 NOTE — ED Notes (Signed)
Threw med wrappers in sharps/trash prior to scanning.

## 2017-03-06 NOTE — ED Notes (Signed)
X-ray at bedside

## 2017-03-06 NOTE — Consult Note (Signed)
NEURO HOSPITALIST CONSULT NOTE   Requestig physician: Dr. Rex Kras   Reason for Consult: Multiple neurological complaints   History obtained from:  Patient   and chart  HPI:                                                                                                                                          Caitlin Glass is an 36 y.o. female who was sent to the emergency department by Dr. Delice Lesch secondary to multiple neurological complaints.  In Dr. Amparo Bristol note today he gives a good history.  Unfortunately the patient gives a very poor history today in the emergency department.  Per Dr. Delice Lesch his history " presenting for new onset symptoms of headache and right-sided symptoms. She was in her usual state of health until 02/23/17 when she started having right facial numbness and intermittent right parietal headaches. She saw her PCP and while in the office she recalls a "zapping" feeling on the right side of her head lasting 15 seconds. It is unclear what occurred in the PCP office, she may have lost consciousness, but she was brought to [facility] where she was admitted for 2 days, she reports a discharge diagnosis of "TIA" and was discharged on aspirin and Keppra. She reports being told she had jerking and turned gray blue, she does not recall this. [facility] imaging studies were reviewed, she had an MRI brain with and without contrast which was normal, MRA head without contrast normal, carotid dopplers normal. She was back in the ER at [facility] 4 days ago for seizures. She denies any seizures, stating she was in the ER due to right-sided headache, nausea, dizziness, and right-sided weakness."  She presented to Dr. Amparo Bristol officetoday feeling unwell, "holding the trash can in front of her, with constant nausea/dry heaving.  She admits to having nausea and vomiting and stated that she did get a cold over Thanksgiving. She reports symptoms are much better when lying  down, worse when sitting and standing. Over the past 2 days, right parietal headache has been more consistent, up to 8-9/10 when standing. She describes pain as throbbing.  She did not note any neck pain at this time.  But stated that she is having a zapping that goes from the neck up and around her right ear.  She also describes a headache that is over the right temporal and migrates down over the right zygomatic region of her face.  She continues to feel numbness on the right face, "feels as though she is trapped in the right side of her body and she cannot move the right side of her body as fast as she usually can."   At this point time she did not describe any of what was noted below,  but during her interview with Dr. Rosalie Doctor apparently this was reported: "When she tries to walk, she has difficulty moving her right leg. She reports it needs a lot of concentration to work that side of the body. She now has vision changes with flickering and "smokiness" on the right side of her peripheral vision. She has been unable to keep any food down, and reports losing 21 lbs in the past 2 weeks, "nothing tastes good."  Unfortunately when talking to her during our consultation patient did not divulge any information even a specific questions      Past Medical History:  Diagnosis Date  . Anxiety   . Seizures ([place])   . TBI (traumatic brain injury) ([place]) 2008    Past Surgical History:  Procedure Laterality Date  . APPENDECTOMY    . CHOLECYSTECTOMY    . DILATION AND CURETTAGE OF UTERUS    . ESOPHAGOGASTRODUODENOSCOPY ENDOSCOPY      Family History  Problem Relation Age of Onset  . Seizures Mother   . Stroke Father       Social History:  reports that  has never smoked. she has never used smokeless tobacco. She reports that she uses drugs. Drug: Marijuana. She reports that she does not drink alcohol.  Allergies  Allergen Reactions  . Lidocaine Hives  . Tramadol     seizures     MEDICATIONS:                                                                                                                      No current facility-administered medications for this encounter.    Current Outpatient Medications  Medication Sig Dispense Refill  . amoxicillin (AMOXIL) 875 MG tablet Take 875 mg by mouth 2 (two) times daily.    Marland Kitchen aspirin 325 MG tablet Take 325 mg by mouth daily.    Marland Kitchen levETIRAcetam (KEPPRA) 500 MG tablet Take 500 mg by mouth 2 (two) times daily.    . meclizine (ANTIVERT) 25 MG tablet Take 1 tablet (25 mg total) by mouth 3 (three) times daily as needed for dizziness. 30 tablet 0      ROS:                                                                                                                                       History obtained from the patient  General ROS: Positive for -  weight loss Psychological ROS: negative for - behavioral disorder, hallucinations, memory difficulties, mood swings or suicidal ideation Ophthalmic ROS: Positive for - blurry vision,  ENT ROS: negative for - epistaxis, nasal discharge, oral lesions, sore throat, tinnitus or vertigo Allergy and Immunology ROS: negative for - hives or itchy/watery eyes Hematological and Lymphatic ROS: negative for - bleeding problems, bruising or swollen lymph nodes Endocrine ROS: negative for - galactorrhea, hair pattern changes, polydipsia/polyuria or temperature intolerance Respiratory ROS: negative for - cough, hemoptysis, shortness of breath or wheezing Cardiovascular ROS: negative for - chest pain, dyspnea on exertion, edema or irregular heartbeat Gastrointestinal ROS: Positive for -  diarrhea,  nausea/vomiting  Genito-Urinary ROS: negative for - dysuria, hematuria, incontinence or urinary frequency/urgency Musculoskeletal ROS: negative for - joint swelling or muscular weakness Neurological ROS: as noted in HPI Dermatological ROS: negative for rash and skin lesion changes   Blood pressure 118/77, pulse 80,  temperature 99 F (37.2 C), temperature source Oral, resp. rate 14, height 5\' 5"  (1.651 m), weight 117.5 kg (259 lb), last menstrual period 02/25/2017, SpO2 100 %.   Neurologic Examination:                                                                                                      HEENT-  Normocephalic, no lesions, without obvious abnormality.  Normal external eye and conjunctiva.  Normal TM's bilaterally.  Normal auditory canals and external ears. Normal external nose, mucus membranes and septum.  Normal pharynx. Cardiovascular- S1, S2 normal, pulses palpable throughout   Lungs- chest clear, no wheezing, rales, normal symmetric air entry Abdomen- normal findings: bowel sounds normal Extremities- no edema Lymph-no adenopathy palpable Musculoskeletal-no joint tenderness, deformity or swelling Skin-warm and dry, no hyperpigmentation, vitiligo, or suspicious lesions  Neurological Examination Mental Status: Alert, oriented, thought content very slow and abnormal at times.  When asked specific questions patient at times could not answer or take a long time answering the question.  Speech fluent without evidence of aphasia.  Able to follow 3 step commands without difficulty. Cranial Nerves: II:  Visual fields grossly normal,  III,IV, VI: ptosis not present, extra-ocular motions intact--at rest her left eye is easily tropic secondary to brain injury when she was 36 YO,  bilaterally pupils equal, round, reactive to light and accommodation V,VII: smile symmetric, facial light touch sensation decreased on the right, splitting midline, splits midline to tuning fork VIII: hearing normal bilaterally IX,X: uvula rises symmetrically XI: bilateral shoulder shrug XII: midline tongue extension Motor: Right : Upper extremity   5/5    Left:     Upper extremity   5/5  Lower extremity   5/5     Lower extremity   5/5 Tone and bulk:normal tone throughout; no atrophy noted Sensory: Pinprick and light  touch intact throughout, bilaterally Deep Tendon Reflexes: 2+ and symmetric throughout Plantars: Right: downgoing   Left: downgoing Cerebellar: normal finger-to-nose, normal heel-to-shin test Gait: not tested      Lab Results: Basic Metabolic Panel: Recent Labs  Lab 03/02/17 1406  NA 139  K 4.0  CL 102  CO2 24  GLUCOSE 88  BUN 8  CREATININE 0.84  CALCIUM 9.4    Liver Function Tests: No results for input(s): AST, ALT, ALKPHOS, BILITOT, PROT, ALBUMIN in the last 168 hours. No results for input(s): LIPASE, AMYLASE in the last 168 hours. No results for input(s): AMMONIA in the last 168 hours.  CBC: Recent Labs  Lab 03/02/17 1406  WBC 18.0*  NEUTROABS 13.8*  HGB 14.4  HCT 43.1  MCV 84.5  PLT 527*    Cardiac Enzymes: No results for input(s): CKTOTAL, CKMB, CKMBINDEX, TROPONINI in the last 168 hours.  Lipid Panel: No results for input(s): CHOL, TRIG, HDL, CHOLHDL, VLDL, LDLCALC in the last 168 hours.  CBG: Recent Labs  Lab 03/02/17 1950  DTOIZT 24    Microbiology: No results found for this or any previous visit.  Coagulation Studies: No results for input(s): LABPROT, INR in the last 72 hours.  Imaging: No results found.   Assessment and plan per attending neurologist  Etta Quill PA-C [facility] [phone]  03/06/2017, 1:50 PM    NEUROHOSPITALIST ADDENDUM  ASSESSMENT AND PLAN  Caitlin Glass is an 36 y.o. female who was sent to the emergency department by Dr. Delice Lesch secondary to multiple neurological complaints.  The patient has a past history of TBI and has had occasional headaches since. She also has history of post lumbar puncture headache. However, about 2 weeks ago the patient has been having a sharp pain in the right side of her forehead. She also has been having nausea , vomiting and dizziness - which appear to be positional. She also has been complaining of weakness on the right side for which she was admitted a  [facility] and apparently had an MRI brain, MRA head which were negative as well as carotid Dopplers which is unremarkable. She was sent out with diagnosis of TIA- even though she still had persistent right-sided weakness. She was also diagnosed to have seizures vs pseudoseizures started on Keppra.  Should she presented to Dr. Delice Lesch office with above complaints- was sent her to the ER mainly for CTA neck to rule out dissection, MRV brain to rule out sinus venous thrombosis as patient was still having the symptoms. On my assessment the patient that her main complaint was dizziness she clearly describes as the room spinning and anticlockwise direction as well as breaking out into a sweat every time she sat up or stood. When asked her to sit up- patient is started complaining that she was sweating and diaphoretic even though she clearly was not and requested a rag and she was feeling hot. Also complained of photophobia while in the ER.   Exam: Detailed exam as noted above in H&P as documented by PA-C. On my assessment otherwise nonfocal exam except mild weakness on the right arm and right leg which could be effort dependent.  Impression:  Given her multiple complaints, it is impossible to find a unifying diagnosis apart from complicated migraine if her imaging is negative.  Plan Repeat MRI brain with and without contrast MR venogram CT angiogram of the head and neck Migraine cocktail to see if symptoms improve Will hold LP for now given her history of post LP headache and her symptoms of dizziness on sitting up. PT/OT eval    Karena Addison Netra Postlethwait MD [facility] [phone]  If 7pm to 7am, please call on call as listed on AMION.

## 2017-03-06 NOTE — ED Notes (Addendum)
Patient transported to CT 

## 2017-03-06 NOTE — Patient Instructions (Signed)
Recommend ER for evaluation of blood vessels and spinal tap.

## 2017-03-06 NOTE — ED Notes (Signed)
Report given to [facility] on [facility]

## 2017-03-06 NOTE — ED Notes (Signed)
Got patient undress on the monitor patient is resting with call bell in reach 

## 2017-03-07 ENCOUNTER — Other Ambulatory Visit: Payer: Self-pay

## 2017-03-07 ENCOUNTER — Observation Stay (HOSPITAL_COMMUNITY): Payer: Medicaid Other

## 2017-03-07 DIAGNOSIS — F411 Generalized anxiety disorder: Secondary | ICD-10-CM | POA: Diagnosis present

## 2017-03-07 DIAGNOSIS — H8110 Benign paroxysmal vertigo, unspecified ear: Secondary | ICD-10-CM | POA: Diagnosis present

## 2017-03-07 DIAGNOSIS — Z813 Family history of other psychoactive substance abuse and dependence: Secondary | ICD-10-CM | POA: Diagnosis not present

## 2017-03-07 DIAGNOSIS — R450 Nervousness: Secondary | ICD-10-CM | POA: Diagnosis not present

## 2017-03-07 DIAGNOSIS — Z87820 Personal history of traumatic brain injury: Secondary | ICD-10-CM | POA: Diagnosis not present

## 2017-03-07 DIAGNOSIS — G4700 Insomnia, unspecified: Secondary | ICD-10-CM | POA: Diagnosis not present

## 2017-03-07 DIAGNOSIS — D72829 Elevated white blood cell count, unspecified: Secondary | ICD-10-CM

## 2017-03-07 DIAGNOSIS — R251 Tremor, unspecified: Secondary | ICD-10-CM | POA: Diagnosis present

## 2017-03-07 DIAGNOSIS — Z79899 Other long term (current) drug therapy: Secondary | ICD-10-CM | POA: Diagnosis not present

## 2017-03-07 DIAGNOSIS — R202 Paresthesia of skin: Secondary | ICD-10-CM | POA: Diagnosis present

## 2017-03-07 DIAGNOSIS — I361 Nonrheumatic tricuspid (valve) insufficiency: Secondary | ICD-10-CM | POA: Diagnosis not present

## 2017-03-07 DIAGNOSIS — R569 Unspecified convulsions: Secondary | ICD-10-CM | POA: Diagnosis present

## 2017-03-07 DIAGNOSIS — Z9049 Acquired absence of other specified parts of digestive tract: Secondary | ICD-10-CM | POA: Diagnosis not present

## 2017-03-07 DIAGNOSIS — R200 Anesthesia of skin: Secondary | ICD-10-CM | POA: Diagnosis not present

## 2017-03-07 DIAGNOSIS — Z8673 Personal history of transient ischemic attack (TIA), and cerebral infarction without residual deficits: Secondary | ICD-10-CM | POA: Diagnosis not present

## 2017-03-07 DIAGNOSIS — R51 Headache: Secondary | ICD-10-CM

## 2017-03-07 DIAGNOSIS — Z7982 Long term (current) use of aspirin: Secondary | ICD-10-CM | POA: Diagnosis not present

## 2017-03-07 DIAGNOSIS — F319 Bipolar disorder, unspecified: Secondary | ICD-10-CM | POA: Diagnosis present

## 2017-03-07 DIAGNOSIS — Z818 Family history of other mental and behavioral disorders: Secondary | ICD-10-CM | POA: Diagnosis not present

## 2017-03-07 DIAGNOSIS — H9311 Tinnitus, right ear: Secondary | ICD-10-CM | POA: Diagnosis present

## 2017-03-07 DIAGNOSIS — Z820 Family history of epilepsy and other diseases of the nervous system: Secondary | ICD-10-CM | POA: Diagnosis not present

## 2017-03-07 DIAGNOSIS — R42 Dizziness and giddiness: Secondary | ICD-10-CM | POA: Diagnosis not present

## 2017-03-07 LAB — URINALYSIS, ROUTINE W REFLEX MICROSCOPIC
BILIRUBIN URINE: NEGATIVE
Glucose, UA: NEGATIVE mg/dL
HGB URINE DIPSTICK: NEGATIVE
KETONES UR: NEGATIVE mg/dL
Leukocytes, UA: NEGATIVE
NITRITE: NEGATIVE
PROTEIN: NEGATIVE mg/dL
Specific Gravity, Urine: >1.046 — ABNORMAL HIGH (ref 1.005–1.030)
pH: 5 (ref 5.0–8.0)

## 2017-03-07 LAB — LIPID PANEL
CHOL/HDL RATIO: 3.5 ratio
Cholesterol: 157 mg/dL (ref 0–200)
HDL: 45 mg/dL (ref >40)
LDL Cholesterol: 82 mg/dL (ref 0–99)
TRIGLYCERIDES: 152 mg/dL — AB (ref <150)
VLDL: 30 mg/dL (ref 0–40)

## 2017-03-07 LAB — CBC WITH DIFFERENTIAL/PLATELET
BASOS ABS: 0.1 10*3/uL (ref 0.0–0.1)
BASOS PCT: 1 %
EOS ABS: 0.3 10*3/uL (ref 0.0–0.7)
EOS PCT: 2 %
HCT: 41.3 % (ref 36.0–46.0)
Hemoglobin: 13.7 g/dL (ref 12.0–15.0)
Lymphocytes Relative: 24 %
Lymphs Abs: 3 10*3/uL (ref 0.7–4.0)
MCH: 27.8 pg (ref 26.0–34.0)
MCHC: 33.2 g/dL (ref 30.0–36.0)
MCV: 83.9 fL (ref 78.0–100.0)
Monocytes Absolute: 0.8 10*3/uL (ref 0.1–1.0)
Monocytes Relative: 7 %
NEUTROS PCT: 66 %
Neutro Abs: 8.1 10*3/uL — ABNORMAL HIGH (ref 1.7–7.7)
PLATELETS: 489 10*3/uL — AB (ref 150–400)
RBC: 4.92 MIL/uL (ref 3.87–5.11)
RDW: 14.7 % (ref 11.5–15.5)
WBC: 12.2 10*3/uL — ABNORMAL HIGH (ref 4.0–10.5)

## 2017-03-07 LAB — BASIC METABOLIC PANEL
Anion gap: 12 (ref 5–15)
BUN: 10 mg/dL (ref 6–20)
CO2: 25 mmol/L (ref 22–32)
Calcium: 9.4 mg/dL (ref 8.9–10.3)
Chloride: 100 mmol/L — ABNORMAL LOW (ref 101–111)
Creatinine, Ser: 0.9 mg/dL (ref 0.44–1.00)
Glucose, Bld: 85 mg/dL (ref 65–99)
POTASSIUM: 3.8 mmol/L (ref 3.5–5.1)
SODIUM: 137 mmol/L (ref 135–145)

## 2017-03-07 LAB — HEMOGLOBIN A1C
HEMOGLOBIN A1C: 5.2 % (ref 4.8–5.6)
MEAN PLASMA GLUCOSE: 102.54 mg/dL

## 2017-03-07 LAB — RAPID URINE DRUG SCREEN, HOSP PERFORMED
AMPHETAMINES: NOT DETECTED
BARBITURATES: NOT DETECTED
BENZODIAZEPINES: NOT DETECTED
Cocaine: NOT DETECTED
Opiates: NOT DETECTED
Tetrahydrocannabinol: POSITIVE — AB

## 2017-03-07 LAB — GLUCOSE, CAPILLARY: GLUCOSE-CAPILLARY: 85 mg/dL (ref 65–99)

## 2017-03-07 LAB — CBG MONITORING, ED: GLUCOSE-CAPILLARY: 90 mg/dL (ref 65–99)

## 2017-03-07 LAB — PREGNANCY, URINE: Preg Test, Ur: NEGATIVE

## 2017-03-07 LAB — TROPONIN I: Troponin I: <0.03 ng/mL (ref <0.03)

## 2017-03-07 MED ORDER — LORAZEPAM 2 MG/ML IJ SOLN
1.0000 mg | Freq: Once | INTRAMUSCULAR | Status: AC
  Administered 2017-03-07: 1 mg via INTRAVENOUS

## 2017-03-07 MED ORDER — AMOXICILLIN-POT CLAVULANATE 875-125 MG PO TABS
1.0000 | ORAL_TABLET | Freq: Two times a day (BID) | ORAL | Status: DC
  Administered 2017-03-07 – 2017-03-09 (×5): 1 via ORAL
  Filled 2017-03-07 (×5): qty 1

## 2017-03-07 MED ORDER — ONDANSETRON HCL 4 MG/2ML IJ SOLN
4.0000 mg | Freq: Four times a day (QID) | INTRAMUSCULAR | Status: DC | PRN
  Administered 2017-03-07 – 2017-03-10 (×4): 4 mg via INTRAVENOUS
  Filled 2017-03-07 (×5): qty 2

## 2017-03-07 MED ORDER — LORAZEPAM 2 MG/ML IJ SOLN
1.0000 mg | INTRAMUSCULAR | Status: DC | PRN
  Administered 2017-03-08 (×2): 1 mg via INTRAVENOUS
  Filled 2017-03-07 (×3): qty 1

## 2017-03-07 MED ORDER — LORAZEPAM 1 MG PO TABS
1.0000 mg | ORAL_TABLET | Freq: Once | ORAL | Status: AC
  Administered 2017-03-07: 1 mg via ORAL
  Filled 2017-03-07: qty 1

## 2017-03-07 NOTE — Progress Notes (Signed)
PROGRESS NOTE  Caitlin Glass IWP:[id number] DOB: 1980/06/27 DOA: 03/06/2017 PCP: [facility]  HPI/Recap of past 24 hours: Caitlin Glass is a 37 y.o. female with medical history significant for TBI, anxiety, seizures who was sent from her neurology clinic due to multiple neurologic complaints including R sided parietal headache,shock-like in character, R face numbness. Pt states that she is trapped in the R side of her body. Pt reports some ongoing nausea, dizziness especially from a seated position, but denies any vomiting. Pt denies any chest pain, SOB, abdominal pain, D/C, fever/chills, blurry vision, no focal neurologic deficit. Of note, pt was recently admitted in [facility] in 12/20 for similar issues, which extensive work up was done, all of which were negative. Pt was d/c on ASA, keppra. Pt subsequently had a follow up appt in neurology on 03/06/17 (see note for full details) and was sent to the ED for further evaluation and admission for further management. In the ED, pt had a repeat MRI, CT angio head/neck, MRV all noted to be negative. Neurolgy consulted and patient admitted for further management.  Today, pt reported R sided headache is better, still feels dizzy anytime she tries to get up. Denies any chest pain, SOB, cough, abdominal pain, N/V/D/C, fever/chills.   Assessment/Plan: Active Problems:   Numbness  #R sided H/A, facial numbness Resolving Likely due to ??complex migraine Vs TIA, R/O CVA Extensive work up in [place], was discharged on ASA, keppra In the ED, repeat MRI, CT angio neck/head, MRV all negative ECHO pending Neurology on board: rec possible LP if all work up negative Continue ASA, keppra PT/OT eval Neuro checks, telemetry  #Dizziness ??Orthostatics  Orthostatic vitals daily PT vestibular checks IVF if peristent  #Leukocytosis Resolving, unknown etiology, afebrile Pt was told she had a +BC by her PCP and was started  on PO Augmentin CXR neg U/A: negative, UC pending Will order BC X 2 pending Daily cbc     Code Status: Full  Family Communication: None at beside  Disposition Plan: Home once stable   Consultants:  Neurology  Procedures:  None  Antimicrobials:  PO Augmentin   DVT prophylaxis:  Lovenox   Objective: Vitals:   03/07/17 1115 03/07/17 1130 03/07/17 1145 03/07/17 1241  BP: 108/69 (!) 123/91 121/74 109/70  Pulse: 84 85 76 90  Resp:      Temp:      TempSrc:      SpO2: 97% 97% 100% 99%  Weight:      Height:       No intake or output data in the 24 hours ending 03/07/17 1404 Filed Weights   03/06/17 1240  Weight: 117.5 kg (259 lb)    Exam:   General:  Alert, awake, oriented X 3, NAD  Cardiovascular: S1-S2 present, no added hrt sound  Respiratory: Chest clear bilaterally  Abdomen: Soft, non-tender, non-distended, BS present  Musculoskeletal: No pedal edema bilaterally  Skin: Normal   Psychiatry: Normal mood  Neuro: No focal neurologic deficit. Normal strength in all extremities     Data Reviewed: CBC: Recent Labs  Lab 03/02/17 1406 03/06/17 1358 03/06/17 1448 03/07/17 0407  WBC 18.0* 16.3*  --  12.2*  NEUTROABS 13.8*  --   --  8.1*  HGB 14.4 14.7 15.3* 13.7  HCT 43.1 43.8 45.0 41.3  MCV 84.5 83.4  --  83.9  PLT 527* 498*  --  505*   Basic Metabolic Panel: Recent Labs  Lab 03/02/17 1406 03/06/17  1358 03/06/17 1448 03/07/17 0407  NA 139 138 139 137  K 4.0 3.9 3.9 3.8  CL 102 104 102 100*  CO2 24 25  --  25  GLUCOSE 88 90 89 85  BUN 8 8 9 10   CREATININE 0.84 0.79 0.70 0.90  CALCIUM 9.4 9.3  --  9.4   GFR: Estimated Creatinine Clearance: 110.8 mL/min (by C-G formula based on SCr of 0.9 mg/dL). Liver Function Tests: No results for input(s): AST, ALT, ALKPHOS, BILITOT, PROT, ALBUMIN in the last 168 hours. No results for input(s): LIPASE, AMYLASE in the last 168 hours. No results for input(s): AMMONIA in the last 168  hours. Coagulation Profile: No results for input(s): INR, PROTIME in the last 168 hours. Cardiac Enzymes: No results for input(s): CKTOTAL, CKMB, CKMBINDEX, TROPONINI in the last 168 hours. BNP (last 3 results) No results for input(s): PROBNP in the last 8760 hours. HbA1C: Recent Labs    03/07/17 0406  HGBA1C 5.2   CBG: Recent Labs  Lab 03/02/17 1536 03/07/17 0940  GLUCAP 89 90   Lipid Profile: Recent Labs    03/07/17 0406  CHOL 157  HDL 45  LDLCALC 82  TRIG 152*  CHOLHDL 3.5   Thyroid Function Tests: No results for input(s): TSH, T4TOTAL, FREET4, T3FREE, THYROIDAB in the last 72 hours. Anemia Panel: No results for input(s): VITAMINB12, FOLATE, FERRITIN, TIBC, IRON, RETICCTPCT in the last 72 hours. Urine analysis:    Component Value Date/Time   COLORURINE YELLOW 03/07/2017 0314   APPEARANCEUR HAZY (A) 03/07/2017 0314   LABSPEC >1.046 (H) 03/07/2017 0314   PHURINE 5.0 03/07/2017 0314   GLUCOSEU NEGATIVE 03/07/2017 0314   HGBUR NEGATIVE 03/07/2017 0314   BILIRUBINUR NEGATIVE 03/07/2017 0314   KETONESUR NEGATIVE 03/07/2017 0314   PROTEINUR NEGATIVE 03/07/2017 0314   NITRITE NEGATIVE 03/07/2017 0314   LEUKOCYTESUR NEGATIVE 03/07/2017 0314   Sepsis Labs: @LABRCNTIP (procalcitonin:4,lacticidven:4)  )No results found for this or any previous visit (from the past 240 hour(s)).    Studies: Ct Angio Head W Or Wo Contrast  Result Date: 03/06/2017 CLINICAL DATA:  Initial evaluation for acute severe right-sided headache. EXAM: CT ANGIOGRAPHY HEAD AND NECK TECHNIQUE: Multidetector CT imaging of the head and neck was performed using the standard protocol during bolus administration of intravenous contrast. Multiplanar CT image reconstructions and MIPs were obtained to evaluate the vascular anatomy. Carotid stenosis measurements (when applicable) are obtained utilizing NASCET criteria, using the distal internal carotid diameter as the denominator. CONTRAST:  63mL ISOVUE-370  IOPAMIDOL (ISOVUE-370) INJECTION 76% COMPARISON:  None. FINDINGS: CT HEAD FINDINGS Brain: Cerebral volume within normal limits for patient age. No evidence for acute intracranial hemorrhage. No findings to suggest acute large vessel territory infarct. No mass lesion, midline shift, or mass effect. Ventricles are normal in size without evidence for hydrocephalus. No extra-axial fluid collection identified. Vascular: No hyperdense vessel identified. Skull: Scalp soft tissues demonstrate no acute abnormality.Calvarium intact. Sinuses/Orbits: Globes and orbital soft tissues are within normal limits. Visualized paranasal sinuses are clear. No mastoid effusion. CTA NECK FINDINGS Aortic arch: Visualized aortic arch of normal caliber with normal branch pattern. No film rings as great vessels. Visualized subclavian arteries widely patent. Right carotid system: Right common and internal carotid arteries widely patent without stenosis, dissection, or occlusion. Left carotid system: Left common and internal carotid arteries widely patent without stenosis, dissection, or occlusion. Vertebral arteries: Both of the vertebral artery is arise from the subclavian arteries. Vertebral arteries widely patent without stenosis, dissection, or occlusion. Skeleton: No  acute osseous abnormality. No worrisome lytic or blastic osseous lesions. Other neck: No acute soft tissue abnormality within the neck. Salivary glands normal. Thyroid normal. No adenopathy. Upper chest: Visualized upper chest within normal limits. Partially visualized lungs are clear. Review of the MIP images confirms the above findings CTA HEAD FINDINGS Anterior circulation: Internal carotid artery is widely patent to the termini without flow-limiting stenosis. A1 segments widely patent. Right A1 segment fenestrated distally. Normal anterior communicating artery. Anterior cerebral artery is widely patent to their distal aspects without stenosis. M1 segments widely patent  bilaterally. Normal MCA bifurcations. No proximal M2 occlusion. Distal MCA branches well perfused and symmetric. Posterior circulation: Vertebral artery widely patent to the vertebrobasilar junction. Right vertebral artery slightly dominant. Patent left PICA. Right PICA not visualized. Dominant right AICA. Basilar artery widely patent to its distal aspect. Superior cerebral arteries patent bilaterally. Both of the posterior cerebral arteries primarily supplied via the basilar and are well perfused to their distal aspects without stenosis. Small left posterior communicating artery noted. Venous sinuses: Not well evaluated due to arterial timing of the contrast bolus. Anatomic variants: None significant. No aneurysm or vascular malformation. Delayed phase: No abnormal enhancement. Review of the MIP images confirms the above findings IMPRESSION: Normal CTA of the head and neck. No acute intracranial abnormality identified. Electronically Signed   By: Jeannine Boga M.D.   On: 03/06/2017 17:30   Ct Angio Neck W Or Wo Contrast  Result Date: 03/06/2017 CLINICAL DATA:  Initial evaluation for acute severe right-sided headache. EXAM: CT ANGIOGRAPHY HEAD AND NECK TECHNIQUE: Multidetector CT imaging of the head and neck was performed using the standard protocol during bolus administration of intravenous contrast. Multiplanar CT image reconstructions and MIPs were obtained to evaluate the vascular anatomy. Carotid stenosis measurements (when applicable) are obtained utilizing NASCET criteria, using the distal internal carotid diameter as the denominator. CONTRAST:  3mL ISOVUE-370 IOPAMIDOL (ISOVUE-370) INJECTION 76% COMPARISON:  None. FINDINGS: CT HEAD FINDINGS Brain: Cerebral volume within normal limits for patient age. No evidence for acute intracranial hemorrhage. No findings to suggest acute large vessel territory infarct. No mass lesion, midline shift, or mass effect. Ventricles are normal in size without  evidence for hydrocephalus. No extra-axial fluid collection identified. Vascular: No hyperdense vessel identified. Skull: Scalp soft tissues demonstrate no acute abnormality.Calvarium intact. Sinuses/Orbits: Globes and orbital soft tissues are within normal limits. Visualized paranasal sinuses are clear. No mastoid effusion. CTA NECK FINDINGS Aortic arch: Visualized aortic arch of normal caliber with normal branch pattern. No film rings as great vessels. Visualized subclavian arteries widely patent. Right carotid system: Right common and internal carotid arteries widely patent without stenosis, dissection, or occlusion. Left carotid system: Left common and internal carotid arteries widely patent without stenosis, dissection, or occlusion. Vertebral arteries: Both of the vertebral artery is arise from the subclavian arteries. Vertebral arteries widely patent without stenosis, dissection, or occlusion. Skeleton: No acute osseous abnormality. No worrisome lytic or blastic osseous lesions. Other neck: No acute soft tissue abnormality within the neck. Salivary glands normal. Thyroid normal. No adenopathy. Upper chest: Visualized upper chest within normal limits. Partially visualized lungs are clear. Review of the MIP images confirms the above findings CTA HEAD FINDINGS Anterior circulation: Internal carotid artery is widely patent to the termini without flow-limiting stenosis. A1 segments widely patent. Right A1 segment fenestrated distally. Normal anterior communicating artery. Anterior cerebral artery is widely patent to their distal aspects without stenosis. M1 segments widely patent bilaterally. Normal MCA bifurcations. No proximal M2 occlusion.  Distal MCA branches well perfused and symmetric. Posterior circulation: Vertebral artery widely patent to the vertebrobasilar junction. Right vertebral artery slightly dominant. Patent left PICA. Right PICA not visualized. Dominant right AICA. Basilar artery widely patent to  its distal aspect. Superior cerebral arteries patent bilaterally. Both of the posterior cerebral arteries primarily supplied via the basilar and are well perfused to their distal aspects without stenosis. Small left posterior communicating artery noted. Venous sinuses: Not well evaluated due to arterial timing of the contrast bolus. Anatomic variants: None significant. No aneurysm or vascular malformation. Delayed phase: No abnormal enhancement. Review of the MIP images confirms the above findings IMPRESSION: Normal CTA of the head and neck. No acute intracranial abnormality identified. Electronically Signed   By: Jeannine Boga M.D.   On: 03/06/2017 17:30   Mr Jeri Cos UX Contrast  Result Date: 03/06/2017 CLINICAL DATA:  Initial evaluation for acute headache, facial numbness. EXAM: MRI HEAD WITHOUT AND WITH CONTRAST MRV HEAD WITHOUT CONTRAST TECHNIQUE: Multiplanar, multiecho pulse sequences of the brain and surrounding structures were obtained without and with intravenous contrast. Angiographic images of the intracranial venous structures were obtained using MRV technique without intravenous contrast. CONTRAST:  47mL MULTIHANCE GADOBENATE DIMEGLUMINE 529 MG/ML IV SOLN COMPARISON:  None. FINDINGS: Brain: Cerebral volume within normal limits for patient age. No focal parenchymal signal abnormality identified. No abnormal foci of restricted diffusion to suggest acute or subacute ischemia. Gray-white matter differentiation well maintained. No encephalomalacia to suggest chronic infarction. No foci of susceptibility artifact to suggest acute or chronic intracranial hemorrhage. No mass lesion, midline shift or mass effect. No hydrocephalus. No extra-axial fluid collection. Major dural sinuses are grossly patent. Pituitary gland and suprasellar region are normal. Midline structures intact and normal. No abnormal enhancement. Vascular: Major intracranial vascular flow voids well maintained and normal in  appearance. Skull and upper cervical spine: Craniocervical junction normal. Visualized upper cervical spine within normal limits. Bone marrow signal intensity normal. No scalp soft tissue abnormality. Sinuses/Orbits: Globes and orbital soft tissues within normal limits. Paranasal sinuses are clear. No mastoid effusion. Inner ear structures normal. Other: Dedicated MRV images were performed. Superior sagittal sinus patent to the level of the torcula. Torcula itself is patent. Transverse and sigmoid sinuses are patent bilaterally as are the proximal internal jugular veins. Straight sinus, vein of Galen common internal cerebral veins patent. No evidence for dural sinus thrombosis. IMPRESSION: 1. Normal brain MRI.  No acute intracranial abnormality. 2. Negative MRV of the head. No evidence for dural sinus thrombosis. Electronically Signed   By: Jeannine Boga M.D.   On: 03/06/2017 18:46   Mr Annell Greening Head  Result Date: 03/06/2017 CLINICAL DATA:  Initial evaluation for acute headache, facial numbness. EXAM: MRI HEAD WITHOUT AND WITH CONTRAST MRV HEAD WITHOUT CONTRAST TECHNIQUE: Multiplanar, multiecho pulse sequences of the brain and surrounding structures were obtained without and with intravenous contrast. Angiographic images of the intracranial venous structures were obtained using MRV technique without intravenous contrast. CONTRAST:  71mL MULTIHANCE GADOBENATE DIMEGLUMINE 529 MG/ML IV SOLN COMPARISON:  None. FINDINGS: Brain: Cerebral volume within normal limits for patient age. No focal parenchymal signal abnormality identified. No abnormal foci of restricted diffusion to suggest acute or subacute ischemia. Gray-white matter differentiation well maintained. No encephalomalacia to suggest chronic infarction. No foci of susceptibility artifact to suggest acute or chronic intracranial hemorrhage. No mass lesion, midline shift or mass effect. No hydrocephalus. No extra-axial fluid collection. Major dural  sinuses are grossly patent. Pituitary gland and suprasellar region are normal. Midline  structures intact and normal. No abnormal enhancement. Vascular: Major intracranial vascular flow voids well maintained and normal in appearance. Skull and upper cervical spine: Craniocervical junction normal. Visualized upper cervical spine within normal limits. Bone marrow signal intensity normal. No scalp soft tissue abnormality. Sinuses/Orbits: Globes and orbital soft tissues within normal limits. Paranasal sinuses are clear. No mastoid effusion. Inner ear structures normal. Other: Dedicated MRV images were performed. Superior sagittal sinus patent to the level of the torcula. Torcula itself is patent. Transverse and sigmoid sinuses are patent bilaterally as are the proximal internal jugular veins. Straight sinus, vein of Galen common internal cerebral veins patent. No evidence for dural sinus thrombosis. IMPRESSION: 1. Normal brain MRI.  No acute intracranial abnormality. 2. Negative MRV of the head. No evidence for dural sinus thrombosis. Electronically Signed   By: Jeannine Boga M.D.   On: 03/06/2017 18:46   Dg Chest Port 1 View  Result Date: 03/06/2017 CLINICAL DATA:  Initial evaluation for leukocytosis, right-sided numbness. EXAM: PORTABLE CHEST 1 VIEW COMPARISON:  Prior radiograph from 07/10/2016. FINDINGS: The cardiac and mediastinal silhouettes are stable in size and contour, and remain within normal limits. The lungs are normally inflated. No airspace consolidation, pleural effusion, or pulmonary edema is identified. There is no pneumothorax. No acute osseous abnormality identified. IMPRESSION: No active cardiopulmonary disease. Electronically Signed   By: Jeannine Boga M.D.   On: 03/06/2017 23:54    Scheduled Meds: . amoxicillin-clavulanate  1 tablet Oral Q12H  . enoxaparin (LOVENOX) injection  40 mg Subcutaneous Q24H  . levETIRAcetam  500 mg Oral BID  . LORazepam  1 mg Intravenous Once     Continuous Infusions:   LOS: 0 days     Alma Friendly, MD [facility]  If 7PM-7AM, please contact night-coverage [email or web address] Password [email or web address] 03/07/2017, 2:04 PM

## 2017-03-07 NOTE — Progress Notes (Signed)
Reason for consult:   Subjective: Patient no longer complaining of headache or sharp stinging sensation on the right side of her head. Still weak on the right side.  Still complains of dizziness and sensation of room spinning on sitting and standing. This is not associated with headache.   ROS: negative except above   Examination  Vital signs in last 24 hours: Temp:  [98.2 F (36.8 C)-98.9 F (37.2 C)] 98.9 F (37.2 C) (01/01 1738) Pulse Rate:  [63-90] 85 (01/01 1738) Resp:  [16-18] 16 (01/01 1738) BP: (97-132)/(54-91) 117/74 (01/01 1738) SpO2:  [94 %-100 %] 98 % (01/01 1738)  General: Not in distress, cooperative CVS: pulse-normal rate and rhythm RS: breathing comfortably Extremities: normal   Neuro: MS: Alert, oriented, follows commands CN: pupils equal and reactive,  EOMI, face symmetric, tongue midline, normal sensation over face, Motor: 5/5 strength in all 4 extremities Reflexes: 2+ bilaterally over patella, biceps, plantars: flexor Coordination: normal Gait: not tested  Basic Metabolic Panel: Recent Labs  Lab 03/02/17 1406 03/06/17 1358 03/06/17 1448 03/07/17 0407  NA 139 138 139 137  K 4.0 3.9 3.9 3.8  CL 102 104 102 100*  CO2 24 25  --  25  GLUCOSE 88 90 89 85  BUN 8 8 9 10   CREATININE 0.84 0.79 0.70 0.90  CALCIUM 9.4 9.3  --  9.4    CBC: Recent Labs  Lab 03/02/17 1406 03/06/17 1358 03/06/17 1448 03/07/17 0407  WBC 18.0* 16.3*  --  12.2*  NEUTROABS 13.8*  --   --  8.1*  HGB 14.4 14.7 15.3* 13.7  HCT 43.1 43.8 45.0 41.3  MCV 84.5 83.4  --  83.9  PLT 527* 498*  --  489*     Coagulation Studies: No results for input(s): LABPROT, INR in the last 72 hours.  Imaging Reviewed: MRI and CTA images    ASSESSMENT AND PLAN  Caitlin Glass a 37 y.o.femalewith medical history significant forTBI, anxiety, seizures who was sent from neurology clinic due to multiple neurologic complaints including R sided parietal headache,shock-like in  character, R face numbness and vertigo on sitting/standing up. She was recently admitted in [facility] with similar complaints. MRI brain with and without contrast negative for acute stroke, MS or any organic lesions. MR venogram negative for sinus venous thrombosis and CTA negative for dissection.  Ct Angio Head W Or Wo Contrast  Result Date: 03/06/2017 CLINICAL DATA:  Initial evaluation for acute severe right-sided headache. EXAM: CT ANGIOGRAPHY HEAD AND NECK TECHNIQUE: Multidetector CT imaging of the head and neck was performed using the standard protocol during bolus administration of intravenous contrast. Multiplanar CT image reconstructions and MIPs were obtained to evaluate the vascular anatomy. Carotid stenosis measurements (when applicable) are obtained utilizing NASCET criteria, using the distal internal carotid diameter as the denominator. CONTRAST:  69mL ISOVUE-370 IOPAMIDOL (ISOVUE-370) INJECTION 76% COMPARISON:  None. FINDINGS: CT HEAD FINDINGS Brain: Cerebral volume within normal limits for patient age. No evidence for acute intracranial hemorrhage. No findings to suggest acute large vessel territory infarct. No mass lesion, midline shift, or mass effect. Ventricles are normal in size without evidence for hydrocephalus. No extra-axial fluid collection identified. Vascular: No hyperdense vessel identified. Skull: Scalp soft tissues demonstrate no acute abnormality.Calvarium intact. Sinuses/Orbits: Globes and orbital soft tissues are within normal limits. Visualized paranasal sinuses are clear. No mastoid effusion. CTA NECK FINDINGS Aortic arch: Visualized aortic arch of normal caliber with normal branch pattern. No film rings as great vessels. Visualized subclavian  arteries widely patent. Right carotid system: Right common and internal carotid arteries widely patent without stenosis, dissection, or occlusion. Left carotid system: Left common and internal carotid arteries widely patent  without stenosis, dissection, or occlusion. Vertebral arteries: Both of the vertebral artery is arise from the subclavian arteries. Vertebral arteries widely patent without stenosis, dissection, or occlusion. Skeleton: No acute osseous abnormality. No worrisome lytic or blastic osseous lesions. Other neck: No acute soft tissue abnormality within the neck. Salivary glands normal. Thyroid normal. No adenopathy. Upper chest: Visualized upper chest within normal limits. Partially visualized lungs are clear. Review of the MIP images confirms the above findings CTA HEAD FINDINGS Anterior circulation: Internal carotid artery is widely patent to the termini without flow-limiting stenosis. A1 segments widely patent. Right A1 segment fenestrated distally. Normal anterior communicating artery. Anterior cerebral artery is widely patent to their distal aspects without stenosis. M1 segments widely patent bilaterally. Normal MCA bifurcations. No proximal M2 occlusion. Distal MCA branches well perfused and symmetric. Posterior circulation: Vertebral artery widely patent to the vertebrobasilar junction. Right vertebral artery slightly dominant. Patent left PICA. Right PICA not visualized. Dominant right AICA. Basilar artery widely patent to its distal aspect. Superior cerebral arteries patent bilaterally. Both of the posterior cerebral arteries primarily supplied via the basilar and are well perfused to their distal aspects without stenosis. Small left posterior communicating artery noted. Venous sinuses: Not well evaluated due to arterial timing of the contrast bolus. Anatomic variants: None significant. No aneurysm or vascular malformation. Delayed phase: No abnormal enhancement. Review of the MIP images confirms the above findings IMPRESSION: Normal CTA of the head and neck. No acute intracranial abnormality identified. Electronically Signed   By: Jeannine Boga M.D.   On: 03/06/2017 17:30   Ct Angio Neck W Or Wo  Contrast  Result Date: 03/06/2017 CLINICAL DATA:  Initial evaluation for acute severe right-sided headache. EXAM: CT ANGIOGRAPHY HEAD AND NECK TECHNIQUE: Multidetector CT imaging of the head and neck was performed using the standard protocol during bolus administration of intravenous contrast. Multiplanar CT image reconstructions and MIPs were obtained to evaluate the vascular anatomy. Carotid stenosis measurements (when applicable) are obtained utilizing NASCET criteria, using the distal internal carotid diameter as the denominator. CONTRAST:  65mL ISOVUE-370 IOPAMIDOL (ISOVUE-370) INJECTION 76% COMPARISON:  None. FINDINGS: CT HEAD FINDINGS Brain: Cerebral volume within normal limits for patient age. No evidence for acute intracranial hemorrhage. No findings to suggest acute large vessel territory infarct. No mass lesion, midline shift, or mass effect. Ventricles are normal in size without evidence for hydrocephalus. No extra-axial fluid collection identified. Vascular: No hyperdense vessel identified. Skull: Scalp soft tissues demonstrate no acute abnormality.Calvarium intact. Sinuses/Orbits: Globes and orbital soft tissues are within normal limits. Visualized paranasal sinuses are clear. No mastoid effusion. CTA NECK FINDINGS Aortic arch: Visualized aortic arch of normal caliber with normal branch pattern. No film rings as great vessels. Visualized subclavian arteries widely patent. Right carotid system: Right common and internal carotid arteries widely patent without stenosis, dissection, or occlusion. Left carotid system: Left common and internal carotid arteries widely patent without stenosis, dissection, or occlusion. Vertebral arteries: Both of the vertebral artery is arise from the subclavian arteries. Vertebral arteries widely patent without stenosis, dissection, or occlusion. Skeleton: No acute osseous abnormality. No worrisome lytic or blastic osseous lesions. Other neck: No acute soft tissue  abnormality within the neck. Salivary glands normal. Thyroid normal. No adenopathy. Upper chest: Visualized upper chest within normal limits. Partially visualized lungs are clear. Review of the  MIP images confirms the above findings CTA HEAD FINDINGS Anterior circulation: Internal carotid artery is widely patent to the termini without flow-limiting stenosis. A1 segments widely patent. Right A1 segment fenestrated distally. Normal anterior communicating artery. Anterior cerebral artery is widely patent to their distal aspects without stenosis. M1 segments widely patent bilaterally. Normal MCA bifurcations. No proximal M2 occlusion. Distal MCA branches well perfused and symmetric. Posterior circulation: Vertebral artery widely patent to the vertebrobasilar junction. Right vertebral artery slightly dominant. Patent left PICA. Right PICA not visualized. Dominant right AICA. Basilar artery widely patent to its distal aspect. Superior cerebral arteries patent bilaterally. Both of the posterior cerebral arteries primarily supplied via the basilar and are well perfused to their distal aspects without stenosis. Small left posterior communicating artery noted. Venous sinuses: Not well evaluated due to arterial timing of the contrast bolus. Anatomic variants: None significant. No aneurysm or vascular malformation. Delayed phase: No abnormal enhancement. Review of the MIP images confirms the above findings IMPRESSION: Normal CTA of the head and neck. No acute intracranial abnormality identified. Electronically Signed   By: Jeannine Boga M.D.   On: 03/06/2017 17:30   Mr Jeri Cos TM Contrast  Result Date: 03/06/2017 CLINICAL DATA:  Initial evaluation for acute headache, facial numbness. EXAM: MRI HEAD WITHOUT AND WITH CONTRAST MRV HEAD WITHOUT CONTRAST TECHNIQUE: Multiplanar, multiecho pulse sequences of the brain and surrounding structures were obtained without and with intravenous contrast. Angiographic images of  the intracranial venous structures were obtained using MRV technique without intravenous contrast. CONTRAST:  63mL MULTIHANCE GADOBENATE DIMEGLUMINE 529 MG/ML IV SOLN COMPARISON:  None. FINDINGS: Brain: Cerebral volume within normal limits for patient age. No focal parenchymal signal abnormality identified. No abnormal foci of restricted diffusion to suggest acute or subacute ischemia. Gray-white matter differentiation well maintained. No encephalomalacia to suggest chronic infarction. No foci of susceptibility artifact to suggest acute or chronic intracranial hemorrhage. No mass lesion, midline shift or mass effect. No hydrocephalus. No extra-axial fluid collection. Major dural sinuses are grossly patent. Pituitary gland and suprasellar region are normal. Midline structures intact and normal. No abnormal enhancement. Vascular: Major intracranial vascular flow voids well maintained and normal in appearance. Skull and upper cervical spine: Craniocervical junction normal. Visualized upper cervical spine within normal limits. Bone marrow signal intensity normal. No scalp soft tissue abnormality. Sinuses/Orbits: Globes and orbital soft tissues within normal limits. Paranasal sinuses are clear. No mastoid effusion. Inner ear structures normal. Other: Dedicated MRV images were performed. Superior sagittal sinus patent to the level of the torcula. Torcula itself is patent. Transverse and sigmoid sinuses are patent bilaterally as are the proximal internal jugular veins. Straight sinus, vein of Galen common internal cerebral veins patent. No evidence for dural sinus thrombosis. IMPRESSION: 1. Normal brain MRI.  No acute intracranial abnormality. 2. Negative MRV of the head. No evidence for dural sinus thrombosis. Electronically Signed   By: Jeannine Boga M.D.   On: 03/06/2017 18:46   Mr Annell Greening Head  Result Date: 03/06/2017 CLINICAL DATA:  Initial evaluation for acute headache, facial numbness. EXAM: MRI HEAD  WITHOUT AND WITH CONTRAST MRV HEAD WITHOUT CONTRAST TECHNIQUE: Multiplanar, multiecho pulse sequences of the brain and surrounding structures were obtained without and with intravenous contrast. Angiographic images of the intracranial venous structures were obtained using MRV technique without intravenous contrast. CONTRAST:  48mL MULTIHANCE GADOBENATE DIMEGLUMINE 529 MG/ML IV SOLN COMPARISON:  None. FINDINGS: Brain: Cerebral volume within normal limits for patient age. No focal parenchymal signal abnormality identified. No abnormal foci of  restricted diffusion to suggest acute or subacute ischemia. Gray-white matter differentiation well maintained. No encephalomalacia to suggest chronic infarction. No foci of susceptibility artifact to suggest acute or chronic intracranial hemorrhage. No mass lesion, midline shift or mass effect. No hydrocephalus. No extra-axial fluid collection. Major dural sinuses are grossly patent. Pituitary gland and suprasellar region are normal. Midline structures intact and normal. No abnormal enhancement. Vascular: Major intracranial vascular flow voids well maintained and normal in appearance. Skull and upper cervical spine: Craniocervical junction normal. Visualized upper cervical spine within normal limits. Bone marrow signal intensity normal. No scalp soft tissue abnormality. Sinuses/Orbits: Globes and orbital soft tissues within normal limits. Paranasal sinuses are clear. No mastoid effusion. Inner ear structures normal. Other: Dedicated MRV images were performed. Superior sagittal sinus patent to the level of the torcula. Torcula itself is patent. Transverse and sigmoid sinuses are patent bilaterally as are the proximal internal jugular veins. Straight sinus, vein of Galen common internal cerebral veins patent. No evidence for dural sinus thrombosis. IMPRESSION: 1. Normal brain MRI.  No acute intracranial abnormality. 2. Negative MRV of the head. No evidence for dural sinus  thrombosis. Electronically Signed   By: Jeannine Boga M.D.   On: 03/06/2017 18:46    ASSESSMENT AND PLAN  Caitlin Glass a 37 y.o.femalewith medical history significant forTBI, anxiety, seizures who was sent from neurology clinic due to multiple neurologic complaints including R sided parietal headache,shock-like in character, R face numbness and vertigo on sitting/standing up. She was recently admitted in [facility] with similar complaints. MRI brain with and without contrast negative for acute stroke, MS or any organic lesions. MR venogram negative for sinus venous thrombosis and CTA negative for dissection. Patient started on Augmentin recently for ? +BC and leukocytosis, also UDS + for THC.   Resolved problems: Headache, shooting sensation in right side of face   Current problems Positional vertigo on sitting and standing up described as anticlockwise spinning of room Mild right-sided weakness   Impression: Complicated migraine ? Resolving ,  Anxiety?  Vertigo  Recommendations  ENT consult for vertigo Discontinue marijuana  Consider stopping Augmentin switching to other antibiotic PT eval, orthostatics    Melford Tullier [facility] Pager Number [phone] For questions after 7pm please refer to AMION to reach the Neurologist on call

## 2017-03-07 NOTE — ED Notes (Signed)
Pt. Ambulated to bathroom and given urine cup.

## 2017-03-07 NOTE — Progress Notes (Addendum)
Called by nursing for a seizure like episode. Patient noted to have eyes open, staring straight ahead and not responding to questions when RN entered the room. Patient then exhibited diffuse tremors. Eye deviation to the left was also noted in addition to nystagmus. Following IV Ativan, the patient was able to answer questions but appeared sedated or postictal.   BP 132/83 (BP Location: Right Arm)   Pulse 92   Temp 98.1 F (36.7 C) (Oral)   Resp 18   Ht 5\' 5"  (1.651 m)   Wt 117.5 kg (259 lb)   LMP 02/25/2017   SpO2 100%   BMI 43.10 kg/m   Exam: Able to answer simple questions and follow commands. Sedated affect. When eyes are held open, left globe deviates medially with right globe at midline. Patient's eyes become conjugate when asked to gaze to the left and right. Saccadic pursuits are noted, worse with rightward than leftward tracking. Pupils equal. No facial droop noted. Poor effort on motor exam. Exhibits claw-like posturing of hands and digits.   A/R: 37 year old female with TBI, anxiety and seizure disorder. She was sent from her neurology clinic due to multiple neurologic complaints including R sided parietal headache,shock-like in character and R face numbness. Patient also stated that "she is trapped in the R side of her body". She also had reported some ongoing nausea and dizziness. 1. Recurrence of seizure like activity. DDx includes seizure and pseudoseizure. Will need LTM EEG (ordered). Continue Keppra.  2. Ativan 1-2 mg IV q4h PRN seizure.  3. Stated complaint of anxiety which she feels may be due to [place]. If LTM EEG shows epileptiform activity, switching to an alternate IV anticonvulsant such as valproic acid may be indicated. She states that she does not intend to become pregnant.   Electronically signed: Dr. Kerney Elbe

## 2017-03-07 NOTE — Progress Notes (Signed)
2140Pt was found in bed side laying position. Primary RN at bedside. Pt unresponsive to pain. Eyes are shut and B/L hand contracted inward. L Eye was noted to be deviated up and to the left with a fine nystagmus. R eye was noted to deviated L midline. Pupils were 12mm round and reactive. Pt had a fine tremor in upper extremities and around mouth with jaw clinched. VSS. 2142 MD paged and respond and gave order to give IV ativan 1mg  now. Neuro MD paged and responded to bed side.  2145 Pt is lethargic but started to respond to voice and able to give one word answers. Pt able to state name and able to state that is pt in hospital. Pt not oriented to time or situation. Pt is able to follow some commands. Pt L eye is able to tract movement R still has some deviation to the L. RN noticed that the pt fine tremors have stopped. Primary RN at bedside and will continue to monitor pt.

## 2017-03-08 ENCOUNTER — Inpatient Hospital Stay (HOSPITAL_COMMUNITY): Payer: Medicaid Other

## 2017-03-08 ENCOUNTER — Observation Stay (HOSPITAL_COMMUNITY): Payer: Medicaid Other

## 2017-03-08 DIAGNOSIS — I361 Nonrheumatic tricuspid (valve) insufficiency: Secondary | ICD-10-CM

## 2017-03-08 DIAGNOSIS — R202 Paresthesia of skin: Secondary | ICD-10-CM

## 2017-03-08 LAB — ECHOCARDIOGRAM COMPLETE
AOASC: 29 cm
Area-P 1/2: 3.61 cm2
E decel time: 208 msec
FS: 25 % — AB (ref 28–44)
Height: 65 in
IVS/LV PW RATIO, ED: 0.74
LA diam index: 1.17 cm/m2
LA vol A4C: 25.7 ml
LA vol index: 14.6 mL/m2
LA vol: 34.7 mL
LASIZE: 28 mm
LDCA: 3.14 cm2
LEFT ATRIUM END SYS DIAM: 28 mm
LV PW d: 12.6 mm — AB (ref 0.6–1.1)
LVOT diameter: 20 mm
MV Dec: 208
MV pk E vel: 57.2 m/s
MVPKAVEL: 49.5 m/s
MVSPHT: 61 ms
TAPSE: 24 mm
Weight: 4144 oz

## 2017-03-08 LAB — CBC WITH DIFFERENTIAL/PLATELET
BASOS PCT: 1 %
Basophils Absolute: 0.1 10*3/uL (ref 0.0–0.1)
EOS ABS: 0.2 10*3/uL (ref 0.0–0.7)
EOS PCT: 2 %
HCT: 42.4 % (ref 36.0–46.0)
Hemoglobin: 14.6 g/dL (ref 12.0–15.0)
Lymphocytes Relative: 23 %
Lymphs Abs: 3.4 10*3/uL (ref 0.7–4.0)
MCH: 28.7 pg (ref 26.0–34.0)
MCHC: 34.4 g/dL (ref 30.0–36.0)
MCV: 83.3 fL (ref 78.0–100.0)
MONOS PCT: 7 %
Monocytes Absolute: 1 10*3/uL (ref 0.1–1.0)
NEUTROS PCT: 67 %
Neutro Abs: 9.9 10*3/uL — ABNORMAL HIGH (ref 1.7–7.7)
PLATELETS: 507 10*3/uL — AB (ref 150–400)
RBC: 5.09 MIL/uL (ref 3.87–5.11)
RDW: 14.4 % (ref 11.5–15.5)
WBC: 14.6 10*3/uL — ABNORMAL HIGH (ref 4.0–10.5)

## 2017-03-08 LAB — HIV ANTIBODY (ROUTINE TESTING W REFLEX): HIV Screen 4th Generation wRfx: NONREACTIVE

## 2017-03-08 LAB — BASIC METABOLIC PANEL
Anion gap: 8 (ref 5–15)
BUN: 9 mg/dL (ref 6–20)
CHLORIDE: 103 mmol/L (ref 101–111)
CO2: 25 mmol/L (ref 22–32)
CREATININE: 0.78 mg/dL (ref 0.44–1.00)
Calcium: 9.2 mg/dL (ref 8.9–10.3)
GFR calc non Af Amer: >60 mL/min (ref >60)
Glucose, Bld: 108 mg/dL — ABNORMAL HIGH (ref 65–99)
POTASSIUM: 3.9 mmol/L (ref 3.5–5.1)
SODIUM: 136 mmol/L (ref 135–145)

## 2017-03-08 LAB — PROCALCITONIN

## 2017-03-08 MED ORDER — SODIUM CHLORIDE 0.9 % IV SOLN
INTRAVENOUS | Status: DC
  Administered 2017-03-08 – 2017-03-09 (×2): via INTRAVENOUS

## 2017-03-08 NOTE — Progress Notes (Signed)
OT Cancellation Note  Patient Details Name: Caitlin Glass MRN: [id number] DOB: [birth year]   Cancelled Treatment:    Reason Eval/Treat Not Completed: Patient not medically ready. Pt appears to be having a seizure at this time. Nursing notified. PA present. Will assess when medically stable.   [place], OT/L  [phone] 03/08/2017 03/08/2017, 9:50 AM

## 2017-03-08 NOTE — Progress Notes (Signed)
EEG tech paged but no answer. Will try again later.

## 2017-03-08 NOTE — Progress Notes (Signed)
Pt up to [facility]. Alert and oriented x4. C/o of generalized soreness. C/of of nausea. Will administer Zofran.

## 2017-03-08 NOTE — Progress Notes (Signed)
EEG tech called and said she would be up shortly to set things up.

## 2017-03-08 NOTE — Evaluation (Signed)
Speech Language Pathology Evaluation Patient Details Name: Caitlin Glass MRN: [id number] DOB: March 13, 1980 Today's Date: 03/08/2017 Time: 8144-8185 SLP Time Calculation (min) (ACUTE ONLY): 25 min  Problem List:  Patient Active Problem List   Diagnosis Date Noted  . TIA (transient ischemic attack) 03/06/2017  . Numbness 03/06/2017   Past Medical History:  Past Medical History:  Diagnosis Date  . Anxiety   . Seizures ([place])   . TBI (traumatic brain injury) ([place]) 2008   Past Surgical History:  Past Surgical History:  Procedure Laterality Date  . APPENDECTOMY    . CHOLECYSTECTOMY    . DILATION AND CURETTAGE OF UTERUS    . ESOPHAGOGASTRODUODENOSCOPY ENDOSCOPY     HPI:  37 y.o. female presenting with right-sided facial numbness, dizziness, and right-sided HA. CT, MRI, and chest X-ray negative. EEG pending. Recent admittion to [facility] (12/20) with pt reported diagnosis of TIA. During outpatient neuro follow up visit, reported to have 3 small seizures. PMH significant of TBI, anxiety, and siezures.    Assessment / Plan / Recommendation Clinical Impression   Pt presents with mild memory deficits which SLP suspects to be related to history of TBI and are now exacerbated by acute illness.  Pt's speech is atypical in affect, prosody, and articulation due to TBI but are functional for conveying needs and wants to caregivers and unchanged from baseline per pt.  As a result, would recommend ST follow up while inpatient to maximize cognitive function.  SLP is hopeful that as pt's seizures and concomitant medical factors are treated that her mentation will improve.      SLP Assessment  SLP Recommendation/Assessment: Patient needs continued Speech Lanaguage Pathology Services SLP Visit Diagnosis: Cognitive communication deficit (R41.841)    Follow Up Recommendations  None    Frequency and Duration min 1 x/week         SLP Evaluation Cognition  Overall Cognitive Status: History of  cognitive impairments - at baseline Arousal/Alertness: Awake/alert Orientation Level: Oriented X4 Attention: Selective Selective Attention: Appears intact Memory: Impaired Memory Impairment: Retrieval deficit Awareness: Appears intact Problem Solving: Appears intact Safety/Judgment: Appears intact       Comprehension  Auditory Comprehension Overall Auditory Comprehension: Appears within functional limits for tasks assessed    Expression Expression Primary Mode of Expression: Verbal Verbal Expression Overall Verbal Expression: Appears within functional limits for tasks assessed   Oral / Motor  Oral Motor/Sensory Function Overall Oral Motor/Sensory Function: Mild impairment Facial ROM: (limited labial ROM) Facial Symmetry: Within Functional Limits Facial Strength: Within Functional Limits Facial Sensation: Reduced right Lingual ROM: (midly reduced) Lingual Symmetry: Within Functional Limits Lingual Strength: Reduced Lingual Sensation: Within Functional Limits Motor Speech Overall Motor Speech: Impaired at baseline Articulation: Impaired Level of Impairment: Conversation Intelligibility: Intelligible Motor Planning: Witnin functional limits   GO                    Denisa Enterline, Selinda Orion 03/08/2017, 3:06 PM

## 2017-03-08 NOTE — Progress Notes (Signed)
PROGRESS NOTE  Caitlin Glass KXF:[id number] DOB: [birth year] DOA: 03/06/2017 PCP: [facility]  HPI/Recap of past 24 hours: Caitlin Glass is a 37 y.o. female with medical history significant for TBI, anxiety, seizures who was sent from her neurology clinic due to multiple neurologic complaints including R sided parietal headache,shock-like in character, R face numbness. Pt states that she is trapped in the R side of her body. Pt reports some ongoing nausea, dizziness especially from a seated position, but denies any vomiting. Pt denies any chest pain, SOB, abdominal pain, D/C, fever/chills, blurry vision, no focal neurologic deficit. Of note, pt was recently admitted in [facility] in 12/20 for similar issues, which extensive work up was done, all of which were negative. Pt was d/c on ASA, keppra. Pt subsequently had a follow up appt in neurology on 03/06/17 (see note for full details) and was sent to the ED for further evaluation and admission for further management. In the ED, pt had a repeat MRI, CT angio head/neck, MRV all noted to be negative. Neurolgy consulted and patient admitted for further management.  Overnight, pt was noted to have multiple episodes of seizure-like activity. RRS, neurologist all evaluated pt overnight. Today, shortly after PT and I rounded on pt, RN reported seizure-like activity. Pt still reported feeling dizzy with a ringing sensation in her R ear, was alert, oriented. Denies any chest pain, SOB, cough, abdominal pain, N/V/D/C, fever/chills.   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 If seizure confirmed may switch from [place] Ordered prolactin (if done early during an episode of seizure and is elevated, could represent a real seizure) For now continue Keppra, IV ativan prn Seizure precautions IVF  #R sided H/A, facial  numbness Resolving Likely due to ??complex migraine Vs TIA, R/O CVA Extensive work up in [place], was discharged on ASA, keppra In the ED, repeat MRI, CT angio neck/head, MRV all negative ECHO pending Neurology on board Continue ASA, keppra PT/OT eval Neuro checks, telemetry  #Dizziness, tinnitus  ??Orthostatics vs Vertigo Currently not orthostatic Orthostatic vitals daily PT vestibular checks If persistent and more stable, may consult ENT for further eval IVF  #Leukocytosis Fluctuating, unknown etiology, afebrile Pt was told she had a +BC by her PCP and was started on PO Augmentin CXR neg U/A: negative, UC has been pending collection Repeat BC X 2 pending Procalcitonin pending Daily cbc     Code Status: Full  Family Communication: None at beside  Disposition Plan: Home once stable   Consultants:  Neurology  Procedures:  EEG on 03/08/17  Antimicrobials:  PO Augmentin   DVT prophylaxis:  Lovenox   Objective: Vitals:   03/07/17 2148 03/08/17 0123 03/08/17 0542 03/08/17 1026  BP: 132/83 129/80 127/81 (!) 119/99  Pulse: 92 95 84 (!) 104  Resp: 18 18 18 18   Temp:  98.5 F (36.9 C) 97.9 F (36.6 C) 98.5 F (36.9 C)  TempSrc:  Oral Oral Axillary  SpO2: 100% 100% 100% 97%  Weight:      Height:        Intake/Output Summary (Last 24 hours) at 03/08/2017 1149 Last data filed at 03/07/2017 2102 Gross per 24 hour  Intake 240 ml  Output -  Net 240 ml   Filed Weights   03/06/17 1240  Weight: 117.5 kg (259 lb)    Exam:   General:  Alert, awake, oriented X 3, NAD  Cardiovascular:  S1-S2 present, no added hrt sound  Respiratory: Chest clear bilaterally  Abdomen: Soft, non-tender, non-distended, BS present  Musculoskeletal: No pedal edema bilaterally  Skin: Normal   Psychiatry: Normal mood  Neuro: No focal neurologic deficit. Slightly weaker on RUE + RLE 4/5     Data Reviewed: CBC: Recent Labs  Lab 03/02/17 1406 03/06/17 1358  03/06/17 1448 03/07/17 0407 03/08/17 0459  WBC 18.0* 16.3*  --  12.2* 14.6*  NEUTROABS 13.8*  --   --  8.1* 9.9*  HGB 14.4 14.7 15.3* 13.7 14.6  HCT 43.1 43.8 45.0 41.3 42.4  MCV 84.5 83.4  --  83.9 83.3  PLT 527* 498*  --  489* 093*   Basic Metabolic Panel: Recent Labs  Lab 03/02/17 1406 03/06/17 1358 03/06/17 1448 03/07/17 0407 03/08/17 1045  NA 139 138 139 137 136  K 4.0 3.9 3.9 3.8 3.9  CL 102 104 102 100* 103  CO2 24 25  --  25 25  GLUCOSE 88 90 89 85 108*  BUN 8 8 9 10 9   CREATININE 0.84 0.79 0.70 0.90 0.78  CALCIUM 9.4 9.3  --  9.4 9.2   GFR: Estimated Creatinine Clearance: 124.6 mL/min (by C-G formula based on SCr of 0.78 mg/dL). Liver Function Tests: No results for input(s): AST, ALT, ALKPHOS, BILITOT, PROT, ALBUMIN in the last 168 hours. No results for input(s): LIPASE, AMYLASE in the last 168 hours. No results for input(s): AMMONIA in the last 168 hours. Coagulation Profile: No results for input(s): INR, PROTIME in the last 168 hours. Cardiac Enzymes: Recent Labs  Lab 03/07/17 2108  TROPONINI <0.03   BNP (last 3 results) No results for input(s): PROBNP in the last 8760 hours. HbA1C: Recent Labs    03/07/17 0406  HGBA1C 5.2   CBG: Recent Labs  Lab 03/02/17 1536 03/07/17 0940 03/07/17 2135  GLUCAP 89 90 85   Lipid Profile: Recent Labs    03/07/17 0406  CHOL 157  HDL 45  LDLCALC 82  TRIG 152*  CHOLHDL 3.5   Thyroid Function Tests: No results for input(s): TSH, T4TOTAL, FREET4, T3FREE, THYROIDAB in the last 72 hours. Anemia Panel: No results for input(s): VITAMINB12, FOLATE, FERRITIN, TIBC, IRON, RETICCTPCT in the last 72 hours. Urine analysis:    Component Value Date/Time   COLORURINE YELLOW 03/07/2017 0314   APPEARANCEUR HAZY (A) 03/07/2017 0314   LABSPEC >1.046 (H) 03/07/2017 0314   PHURINE 5.0 03/07/2017 0314   GLUCOSEU NEGATIVE 03/07/2017 0314   HGBUR NEGATIVE 03/07/2017 0314   BILIRUBINUR NEGATIVE 03/07/2017 0314    KETONESUR NEGATIVE 03/07/2017 0314   PROTEINUR NEGATIVE 03/07/2017 0314   NITRITE NEGATIVE 03/07/2017 0314   LEUKOCYTESUR NEGATIVE 03/07/2017 0314   Sepsis Labs: @LABRCNTIP (procalcitonin:4,lacticidven:4)  )No results found for this or any previous visit (from the past 240 hour(s)).    Studies: No results found.  Scheduled Meds: . amoxicillin-clavulanate  1 tablet Oral Q12H  . enoxaparin (LOVENOX) injection  40 mg Subcutaneous Q24H  . levETIRAcetam  500 mg Oral BID  . LORazepam  1 mg Intravenous Once    Continuous Infusions:   LOS: 1 day     Alma Friendly, MD [facility]  If 7PM-7AM, please contact night-coverage [email or web address] Password [facility] 03/08/2017, 11:49 AM

## 2017-03-08 NOTE — Progress Notes (Signed)
Pt c/o of left lateral shooting chest pain. Rated 9/10. Alert and oriented. Denies SOB. Diaphoretic. VSS. NP notified with stat EKG. EKG completed. Rapid response paged d/t persistent chest pain. NP paged with new order of Ativan.  Ativan PO administered. At 2119, noted staring in space laying on left side for 5min, become less responsive, not answering orientation question. VSS.  Rapid response was paged. SWOT nurse at bedside. NP notified with order Ativan IV.  Neurologist paged and at bedside.

## 2017-03-08 NOTE — Progress Notes (Signed)
EEG tech paged but not available overnight.

## 2017-03-08 NOTE — Evaluation (Signed)
Physical Therapy Evaluation Patient Details Name: Caitlin Glass MRN: [id number] DOB: Feb 16, 1981 Today's Date: 03/08/2017   History of Present Illness  37 y.o. female presenting with right-sided facial numbness, dizziness, and right-sided HA. CT, MRI, and chest X-ray negative. EEG pending. Recent admittion to [facility] (12/20) with pt reported diagnosis of TIA. During outpatient neuro follow up visit, reported to have 3 small seizures. PMH significant of TBI, anxiety, and siezures.   Clinical Impression  Pt presents with dizziness, decreased sensation (tingling) of right side of face, impaired cognition, decreased activity to[place], decreased strength, decreased balance, and impaired mobility secondary to above. Pt tolerates standing at EOB for ~1 minute with min assist with slight swaying and reported dizziness. Pt demonstrates saccadic, jumping, rigid eye movements during smooth-pursuit testing. Pt's left eye demonstrates intermittent adduction while right eye remains at midline. Pt unable to stabilize gaze during gaze stabilization x1 exercise. Pt reports that the room is spinning around her. Pt with episodes of eyes closed and not responding to therapist while seated EOB (seizure?).  Pt with double, blurred vision with both eyes open that subsides with one eye closed (either eye). BP supine: 129/78, seated: 117/72, standing 123/87. Recommending possible vestibular PT follow up upon discharge depending on further vestibular PT follow up when pt is able to tolerate more activity. PT will follow acutely in order to maximize mobility while in hospital setting.      Follow Up Recommendations Home health PT    Equipment Recommendations  Other (comment)(TBD)    Recommendations for Other Services       Precautions / Restrictions Precautions Precautions: Fall Restrictions Weight Bearing Restrictions: No      Mobility  Bed Mobility Overal bed mobility: Needs Assistance Bed Mobility: Supine to  Sit     Supine to sit: Supervision;HOB elevated     General bed mobility comments: Pt is able to use core strength to rise from supine to sit without use of bed railing. Pt moves slowly.   Transfers Overall transfer level: Needs assistance Equipment used: None Transfers: Sit to/from Stand Sit to Stand: Min assist         General transfer comment: Pt fails at first attempt at sit-to-stand from EOB, however, utilizes forward momentum the second try and is successful. Pt requiring min assist to aid with power up and also to steady once standing, as pt with sway. x1 from EOB.   Ambulation/Gait                Stairs            Wheelchair Mobility    Modified Rankin (Stroke Patients Only)       Balance Overall balance assessment: Needs assistance Sitting-balance support: No upper extremity supported;Feet supported Sitting balance-Leahy Scale: Fair Sitting balance - Comments: Pt able to sit EOB without external support, however demonstrates slight swaying and c/o room spinning.      Standing balance-Leahy Scale: Poor Standing balance comment: Pt requiring assist for standing static balance, as pt demonstrates swaying and dizziness.                             Pertinent Vitals/Pain Pain Assessment: No/denies pain    Home Living Family/patient expects to be discharged to:: Private residence Living Arrangements: Children(takes care of 3 children (4, 12, and ?)) Available Help at Discharge: Family;Available PRN/intermittently Type of Home: House Home Access: Stairs to enter Entrance Stairs-Rails: Right;Left Entrance Stairs-Number of Steps:  5 Home Layout: One level        Prior Function Level of Independence: Independent               Hand Dominance        Extremity/Trunk Assessment   Upper Extremity Assessment Upper Extremity Assessment: Defer to OT evaluation    Lower Extremity Assessment Lower Extremity Assessment: Generalized  weakness    Cervical / Trunk Assessment Cervical / Trunk Assessment: Normal  Communication      Cognition Arousal/Alertness: Awake/alert Behavior During Therapy: WFL for tasks assessed/performed Overall Cognitive Status: No family/caregiver present to determine baseline cognitive functioning Area of Impairment: Attention                   Current Attention Level: Focused           General Comments: Pt appears sleepy, but does not report being tired - most likely postictal. Pt closes eyes intermittently throughout session and needing stimulus (calling name) in order to direct her to task.      General Comments General comments (skin integrity, edema, etc.): Pt demonstrates saccadic, jumping, rigid eye movements during smooth-pursuit testing. Pt's left eye demonstrates intermittent adduction while right eye remains at midline. Pt unable to stabilize gaze during gaze stabilization x1 exercise. Pt reports that the room is spinning around her. Pt with double,blurred vision with both eyes open that subsides with one eye closed (either eye). BP supine: 129/78, seated: 117/72, standing 123/87.     Exercises     Assessment/Plan    PT Assessment Patient needs continued PT services  PT Problem List Decreased mobility;Decreased balance;Impaired sensation;Decreased strength;Decreased activity tolerance;Decreased cognition       PT Treatment Interventions DME instruction;Functional mobility training;Balance training;Gait training;Therapeutic activities;Neuromuscular re-education;Patient/family education;Stair training;Therapeutic exercise;Cognitive remediation    PT Goals (Current goals can be found in the Care Plan section)  Acute Rehab PT Goals Patient Stated Goal: to reduce symptoms PT Goal Formulation: With patient Time For Goal Achievement: 03/22/17 Potential to Achieve Goals: Good    Frequency Min 3X/week   Barriers to discharge Decreased caregiver support Pt with three  small children to care for.     Co-evaluation               AM-PAC PT "6 Clicks" Daily Activity  Outcome Measure Difficulty turning over in bed (including adjusting bedclothes, sheets and blankets)?: None Difficulty moving from lying on back to sitting on the side of the bed? : A Little Difficulty sitting down on and standing up from a chair with arms (e.g., wheelchair, bedside commode, etc,.)?: Unable Help needed moving to and from a bed to chair (including a wheelchair)?: A Little Help needed walking in hospital room?: A Little Help needed climbing 3-5 steps with a railing? : A Little 6 Click Score: 17    End of Session Equipment Utilized During Treatment: Gait belt Activity Tolerance: Patient limited by fatigue Patient left: in bed;with call bell/phone within reach;with bed alarm set   PT Visit Diagnosis: Unsteadiness on feet (R26.81);Difficulty in walking, not elsewhere classified (R26.2);Dizziness and giddiness (R42)    Time: 7824-2353 PT Time Calculation (min) (ACUTE ONLY): 23 min   Charges:   PT Evaluation $PT Eval Moderate Complexity: 1 Mod PT Treatments $Therapeutic Activity: 8-22 mins   PT G Codes:        Judee Clara, SPT  Judee Clara 03/08/2017, 10:07 AM

## 2017-03-08 NOTE — Progress Notes (Signed)
Pt found having a seizure. I pushed the button on the EEG to capture. Vitals are stable. Pt is alert and following commands. MD notified. Going to give Ativan and continue to monitor.

## 2017-03-08 NOTE — Progress Notes (Signed)
  Echocardiogram 2D Echocardiogram has been performed.  Caitlin Glass 03/08/2017, 2:16 PM

## 2017-03-08 NOTE — Progress Notes (Signed)
Subjective: Patient is comfortable in her bed.  She is aware of where she is and follow some commands.  No complaints of headache at this time.  Last night neurologist was called for seizure-like episodes.  Patient was noted to have eyes open, staring straight ahead and not responding to questions.  She exhibited diffuse tremors.  Of the time neurologist got to the patient patient was able to answer simple questions and follow commands.  Due to this patient has been hooked up to an LTM.  Her reading is pending  Exam: Vitals:   03/08/17 0542 03/08/17 1026  BP: 127/81 (!) 119/99  Pulse: 84 (!) 104  Resp: 18 18  Temp: 97.9 F (36.6 C) 98.5 F (36.9 C)  SpO2: 100% 97%    HEENT-  Normocephalic, no lesions, without obvious abnormality.  Normal external eye and conjunctiva.  Normal TM's bilaterally.  Normal auditory canals and external ears. Normal external nose, mucus membranes and septum.  Normal pharynx. Cardiovascular- S1, S2 normal, pulses palpable throughout   Lungs- chest clear, no wheezing, rales, normal symmetric air entry Abdomen- normal findings: bowel sounds normal Extremities- no edema Lymph-no adenopathy palpable Musculoskeletal-no joint tenderness, deformity or swelling Skin-warm and dry, no hyperpigmentation, vitiligo, or suspicious lesions   Neuro:   Mental Status: Alert, oriented, thought content very slow and abnormal at times.    Patient only answers certain questions other times would just stare at me and not answer questions. Speech fluent without evidence of aphasia.  Able to follow simple commands without difficulty. 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,  bilaterally pupils equal, round, reactive to light and accommodation V,VII: smile symmetric, facial light touch sensation decreased on the right, splitting  midline, splits midline to tuning fork VIII: hearing normal bilaterally IX,X: uvula rises symmetrically XI: bilateral shoulder shrug XII: midline tongue extension Motor: Moving all extremities antigravity no difficulty Sensory: Pinprick and light touch intact throughout, bilaterally Deep Tendon Reflexes: 2+ and symmetric throughout Plantars: Right: downgoing                                Left: downgoing Cerebellar: normal finger-to-nose, normal heel-to-shin test Gait: not tested       Medications:  Scheduled: . amoxicillin-clavulanate  1 tablet Oral Q12H  . enoxaparin (LOVENOX) injection  40 mg Subcutaneous Q24H  . levETIRAcetam  500 mg Oral BID  . LORazepam  1 mg Intravenous Once   Continuous: . sodium chloride     DDU:[id number] **OR** acetaminophen (TYLENOL) oral liquid 160 mg/5 mL **OR** acetaminophen, LORazepam, ondansetron (ZOFRAN) IV, senna-docusate  Pertinent Labs/Diagnostics: LTM reading pending LDL 82 A1c 5.2 UDS positive for THC Keppra level 6.8  Ct Angio Head W Or Wo Contrast  Result Date: 03/06/2017 CLINICAL DATA:  Initial evaluation for acute severe right-sided headache. EXAM: CT ANGIOGRAPHY HEAD AND NECK TECHNIQUE: Multidetector CT imaging of the head and neck was performed using the standard protocol during bolus administration of intravenous contrast. Multiplanar CT image reconstructions and MIPs were obtained to evaluate the vascular anatomy. Carotid stenosis measurements (when applicable) are obtained utilizing NASCET criteria, using the distal internal carotid diameter as the denominator. CONTRAST:  42mL ISOVUE-370 IOPAMIDOL (ISOVUE-370) INJECTION 76% COMPARISON:  None. FINDINGS: CT HEAD FINDINGS Brain: Cerebral volume within normal limits for patient age. No  evidence for acute intracranial hemorrhage. No findings to suggest acute large vessel territory infarct. No mass lesion, midline shift, or mass effect. Ventricles are normal in size without  evidence for hydrocephalus. No extra-axial fluid collection identified. Vascular: No hyperdense vessel identified. Skull: Scalp soft tissues demonstrate no acute abnormality.Calvarium intact. Sinuses/Orbits: Globes and orbital soft tissues are within normal limits. Visualized paranasal sinuses are clear. No mastoid effusion. CTA NECK FINDINGS Aortic arch: Visualized aortic arch of normal caliber with normal branch pattern. No film rings as great vessels. Visualized subclavian arteries widely patent. Right carotid system: Right common and internal carotid arteries widely patent without stenosis, dissection, or occlusion. Left carotid system: Left common and internal carotid arteries widely patent without stenosis, dissection, or occlusion. Vertebral arteries: Both of the vertebral artery is arise from the subclavian arteries. Vertebral arteries widely patent without stenosis, dissection, or occlusion. Skeleton: No acute osseous abnormality. No worrisome lytic or blastic osseous lesions. Other neck: No acute soft tissue abnormality within the neck. Salivary glands normal. Thyroid normal. No adenopathy. Upper chest: Visualized upper chest within normal limits. Partially visualized lungs are clear. Review of the MIP images confirms the above findings CTA HEAD FINDINGS Anterior circulation: Internal carotid artery is widely patent to the termini without flow-limiting stenosis. A1 segments widely patent. Right A1 segment fenestrated distally. Normal anterior communicating artery. Anterior cerebral artery is widely patent to their distal aspects without stenosis. M1 segments widely patent bilaterally. Normal MCA bifurcations. No proximal M2 occlusion. Distal MCA branches well perfused and symmetric. Posterior circulation: Vertebral artery widely patent to the vertebrobasilar junction. Right vertebral artery slightly dominant. Patent left PICA. Right PICA not visualized. Dominant right AICA. Basilar artery widely patent to  its distal aspect. Superior cerebral arteries patent bilaterally. Both of the posterior cerebral arteries primarily supplied via the basilar and are well perfused to their distal aspects without stenosis. Small left posterior communicating artery noted. Venous sinuses: Not well evaluated due to arterial timing of the contrast bolus. Anatomic variants: None significant. No aneurysm or vascular malformation. Delayed phase: No abnormal enhancement. Review of the MIP images confirms the above findings IMPRESSION: Normal CTA of the head and neck. No acute intracranial abnormality identified. Electronically Signed   By: Jeannine Boga M.D.   On: 03/06/2017 17:30   Ct Angio Neck W Or Wo Contrast  Result Date: 03/06/2017 CLINICAL DATA:  Initial evaluation for acute severe right-sided headache. EXAM: CT ANGIOGRAPHY HEAD AND NECK TECHNIQUE: Multidetector CT imaging of the head and neck was performed using the standard protocol during bolus administration of intravenous contrast. Multiplanar CT image reconstructions and MIPs were obtained to evaluate the vascular anatomy. Carotid stenosis measurements (when applicable) are obtained utilizing NASCET criteria, using the distal internal carotid diameter as the denominator. CONTRAST:  43mL ISOVUE-370 IOPAMIDOL (ISOVUE-370) INJECTION 76% COMPARISON:  None. FINDINGS: CT HEAD FINDINGS Brain: Cerebral volume within normal limits for patient age. No evidence for acute intracranial hemorrhage. No findings to suggest acute large vessel territory infarct. No mass lesion, midline shift, or mass effect. Ventricles are normal in size without evidence for hydrocephalus. No extra-axial fluid collection identified. Vascular: No hyperdense vessel identified. Skull: Scalp soft tissues demonstrate no acute abnormality.Calvarium intact. Sinuses/Orbits: Globes and orbital soft tissues are within normal limits. Visualized paranasal sinuses are clear. No mastoid effusion. CTA NECK FINDINGS  Aortic arch: Visualized aortic arch of normal caliber with normal branch pattern. No film rings as great vessels. Visualized subclavian arteries widely patent. Right carotid system: Right common and internal carotid  arteries widely patent without stenosis, dissection, or occlusion. Left carotid system: Left common and internal carotid arteries widely patent without stenosis, dissection, or occlusion. Vertebral arteries: Both of the vertebral artery is arise from the subclavian arteries. Vertebral arteries widely patent without stenosis, dissection, or occlusion. Skeleton: No acute osseous abnormality. No worrisome lytic or blastic osseous lesions. Other neck: No acute soft tissue abnormality within the neck. Salivary glands normal. Thyroid normal. No adenopathy. Upper chest: Visualized upper chest within normal limits. Partially visualized lungs are clear. Review of the MIP images confirms the above findings CTA HEAD FINDINGS Anterior circulation: Internal carotid artery is widely patent to the termini without flow-limiting stenosis. A1 segments widely patent. Right A1 segment fenestrated distally. Normal anterior communicating artery. Anterior cerebral artery is widely patent to their distal aspects without stenosis. M1 segments widely patent bilaterally. Normal MCA bifurcations. No proximal M2 occlusion. Distal MCA branches well perfused and symmetric. Posterior circulation: Vertebral artery widely patent to the vertebrobasilar junction. Right vertebral artery slightly dominant. Patent left PICA. Right PICA not visualized. Dominant right AICA. Basilar artery widely patent to its distal aspect. Superior cerebral arteries patent bilaterally. Both of the posterior cerebral arteries primarily supplied via the basilar and are well perfused to their distal aspects without stenosis. Small left posterior communicating artery noted. Venous sinuses: Not well evaluated due to arterial timing of the contrast bolus. Anatomic  variants: None significant. No aneurysm or vascular malformation. Delayed phase: No abnormal enhancement. Review of the MIP images confirms the above findings IMPRESSION: Normal CTA of the head and neck. No acute intracranial abnormality identified. Electronically Signed   By: Jeannine Boga M.D.   On: 03/06/2017 17:30   Mr Jeri Cos YY Contrast  Result Date: 03/06/2017 CLINICAL DATA:  Initial evaluation for acute headache, facial numbness. EXAM: MRI HEAD WITHOUT AND WITH CONTRAST MRV HEAD WITHOUT CONTRAST TECHNIQUE: Multiplanar, multiecho pulse sequences of the brain and surrounding structures were obtained without and with intravenous contrast. Angiographic images of the intracranial venous structures were obtained using MRV technique without intravenous contrast. CONTRAST:  82mL MULTIHANCE GADOBENATE DIMEGLUMINE 529 MG/ML IV SOLN COMPARISON:  None. FINDINGS: Brain: Cerebral volume within normal limits for patient age. No focal parenchymal signal abnormality identified. No abnormal foci of restricted diffusion to suggest acute or subacute ischemia. Gray-white matter differentiation well maintained. No encephalomalacia to suggest chronic infarction. No foci of susceptibility artifact to suggest acute or chronic intracranial hemorrhage. No mass lesion, midline shift or mass effect. No hydrocephalus. No extra-axial fluid collection. Major dural sinuses are grossly patent. Pituitary gland and suprasellar region are normal. Midline structures intact and normal. No abnormal enhancement. Vascular: Major intracranial vascular flow voids well maintained and normal in appearance. Skull and upper cervical spine: Craniocervical junction normal. Visualized upper cervical spine within normal limits. Bone marrow signal intensity normal. No scalp soft tissue abnormality. Sinuses/Orbits: Globes and orbital soft tissues within normal limits. Paranasal sinuses are clear. No mastoid effusion. Inner ear structures normal.  Other: Dedicated MRV images were performed. Superior sagittal sinus patent to the level of the torcula. Torcula itself is patent. Transverse and sigmoid sinuses are patent bilaterally as are the proximal internal jugular veins. Straight sinus, vein of Galen common internal cerebral veins patent. No evidence for dural sinus thrombosis. IMPRESSION: 1. Normal brain MRI.  No acute intracranial abnormality. 2. Negative MRV of the head. No evidence for dural sinus thrombosis. Electronically Signed   By: Jeannine Boga M.D.   On: 03/06/2017 18:46   Mr [facility]  Result Date: 03/06/2017 CLINICAL DATA:  Initial evaluation for acute headache, facial numbness. EXAM: MRI HEAD WITHOUT AND WITH CONTRAST MRV HEAD WITHOUT CONTRAST TECHNIQUE: Multiplanar, multiecho pulse sequences of the brain and surrounding structures were obtained without and with intravenous contrast. Angiographic images of the intracranial venous structures were obtained using MRV technique without intravenous contrast. CONTRAST:  6mL MULTIHANCE GADOBENATE DIMEGLUMINE 529 MG/ML IV SOLN COMPARISON:  None. FINDINGS: Brain: Cerebral volume within normal limits for patient age. No focal parenchymal signal abnormality identified. No abnormal foci of restricted diffusion to suggest acute or subacute ischemia. Gray-white matter differentiation well maintained. No encephalomalacia to suggest chronic infarction. No foci of susceptibility artifact to suggest acute or chronic intracranial hemorrhage. No mass lesion, midline shift or mass effect. No hydrocephalus. No extra-axial fluid collection. Major dural sinuses are grossly patent. Pituitary gland and suprasellar region are normal. Midline structures intact and normal. No abnormal enhancement. Vascular: Major intracranial vascular flow voids well maintained and normal in appearance. Skull and upper cervical spine: Craniocervical junction normal. Visualized upper cervical spine within normal limits. Bone  marrow signal intensity normal. No scalp soft tissue abnormality. Sinuses/Orbits: Globes and orbital soft tissues within normal limits. Paranasal sinuses are clear. No mastoid effusion. Inner ear structures normal. Other: Dedicated MRV images were performed. Superior sagittal sinus patent to the level of the torcula. Torcula itself is patent. Transverse and sigmoid sinuses are patent bilaterally as are the proximal internal jugular veins. Straight sinus, vein of Galen common internal cerebral veins patent. No evidence for dural sinus thrombosis. IMPRESSION: 1. Normal brain MRI.  No acute intracranial abnormality. 2. Negative MRV of the head. No evidence for dural sinus thrombosis. Electronically Signed   By: Jeannine Boga M.D.   On: 03/06/2017 18:46   Dg Chest Port 1 View  Result Date: 03/06/2017 CLINICAL DATA:  Initial evaluation for leukocytosis, right-sided numbness. EXAM: PORTABLE CHEST 1 VIEW COMPARISON:  Prior radiograph from 07/10/2016. FINDINGS: The cardiac and mediastinal silhouettes are stable in size and contour, and remain within normal limits. The lungs are normally inflated. No airspace consolidation, pleural effusion, or pulmonary edema is identified. There is no pneumothorax. No acute osseous abnormality identified. IMPRESSION: No active cardiopulmonary disease. Electronically Signed   By: Jeannine Boga M.D.   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  Patient was sent to ER from neurology clinic for the multiple neurological complaints including right side partial headache, shocklike characteristics of the right face and numbness in addition this feeling that she is "trapped in her right side of her body".  Overnight had questionable seizure activity versus pseudoseizure.  Currently hooked up to LTM.  If LTM shows epileptiform activity, switching to an alternative IV  anticonvulsant such as Depakote may be indicated.  At this point patient did state to previous neurologist last night that she has no intention of becoming pregnant.  Will be continuing to follow LTM results.   Recommendations: 1) continue LTM 2) seizure precautions 3) continue Keppra at current dose 4) Per [facility] statutes, patients with seizures are not allowed to drive until  they have been seizure-free for six months. Use caution when using heavy equipment or power tools. Avoid working on ladders or at heights. Take showers instead of baths. Ensure the water temperature is not too high on the home water heater. Do not go swimming alone. When caring for infants or small children, sit down  when holding, feeding, or changing them to minimize risk of injury to the child in the event you have a seizure.   Also, Maintain good sleep hygiene. Avoid alcohol.  --> Call 911 and bring the patient back to the ED if:                   A.  The seizure lasts longer than 5 minutes.                  B.  The patient doesn't awaken shortly after the seizure             C.  The patient has new problems such as difficulty seeing, speaking or moving             D.  The patient was injured during the seizure             E.  The patient has a temperature over 102 F (39C)             F.  The patient vomited and now is having trouble breathing    03/08/2017, 12:29 PM   NEUROHOSPITALIST ADDENDUM I have discussed the case with Remo Lipps. Formulated plan as documented above by PAC/Resident. LTM connected, Will follow.  Karena Addison Kylei Purington MD [facility] [phone]  If 7pm to 7am, please call on call as listed on AMION.

## 2017-03-08 NOTE — Progress Notes (Signed)
vLTM EEG running. Tested event button, no skin breakdown. notified neuro

## 2017-03-08 NOTE — Progress Notes (Signed)
Pt had seizure, is currently alert and following commands. MD notified, and Ativan was held per MD. EEG being set up. Will continue to monitor.

## 2017-03-09 DIAGNOSIS — R42 Dizziness and giddiness: Secondary | ICD-10-CM

## 2017-03-09 LAB — BASIC METABOLIC PANEL
Anion gap: 8 (ref 5–15)
BUN: 8 mg/dL (ref 6–20)
CALCIUM: 9 mg/dL (ref 8.9–10.3)
CHLORIDE: 101 mmol/L (ref 101–111)
CO2: 25 mmol/L (ref 22–32)
Creatinine, Ser: 0.78 mg/dL (ref 0.44–1.00)
GFR calc non Af Amer: >60 mL/min (ref >60)
Glucose, Bld: 137 mg/dL — ABNORMAL HIGH (ref 65–99)
Potassium: 3.6 mmol/L (ref 3.5–5.1)
SODIUM: 134 mmol/L — AB (ref 135–145)

## 2017-03-09 LAB — CBC WITH DIFFERENTIAL/PLATELET
BASOS PCT: 1 %
Basophils Absolute: 0.1 10*3/uL (ref 0.0–0.1)
EOS ABS: 0.2 10*3/uL (ref 0.0–0.7)
EOS PCT: 2 %
HCT: 40.4 % (ref 36.0–46.0)
Hemoglobin: 13.5 g/dL (ref 12.0–15.0)
LYMPHS ABS: 2.3 10*3/uL (ref 0.7–4.0)
Lymphocytes Relative: 17 %
MCH: 28.1 pg (ref 26.0–34.0)
MCHC: 33.4 g/dL (ref 30.0–36.0)
MCV: 84 fL (ref 78.0–100.0)
MONO ABS: 0.9 10*3/uL (ref 0.1–1.0)
MONOS PCT: 6 %
Neutro Abs: 10 10*3/uL — ABNORMAL HIGH (ref 1.7–7.7)
Neutrophils Relative %: 74 %
Platelets: 463 10*3/uL — ABNORMAL HIGH (ref 150–400)
RBC: 4.81 MIL/uL (ref 3.87–5.11)
RDW: 14.5 % (ref 11.5–15.5)
WBC: 13.5 10*3/uL — ABNORMAL HIGH (ref 4.0–10.5)

## 2017-03-09 LAB — URINE CULTURE: CULTURE: NO GROWTH

## 2017-03-09 LAB — PROCALCITONIN: Procalcitonin: <0.1 ng/mL

## 2017-03-09 LAB — PROLACTIN: PROLACTIN: 19.2 ng/mL (ref 4.8–23.3)

## 2017-03-09 NOTE — Progress Notes (Addendum)
Physical Therapy Treatment Patient Details Name: Caitlin Glass MRN: [id number] DOB: 1981/02/08 Today's Date: 03/09/2017    History of Present Illness 37 y.o. female presenting with right-sided facial numbness, dizziness, and right-sided HA. CT, MRI, and chest X-ray negative. EEG pending. Recent admittion to [facility] (12/20) with pt reported diagnosis of TIA. During outpatient neuro follow up visit, reported to have 3 small seizures. PMH significant of TBI, anxiety, and siezures.     PT Comments    Patient continues to have issues with dizziness impacting balance/safety.  Patient also c/o RUE/RLE feeling heavy, "like they are not mine".  Patient with 3/5 strength RLE, and 4/5 strength RUE during MMT today.  Difficulty using RUE - different from function during OT session.  Patient able to stand with +1 assist.  Unable to take steps today due to function of RLE and dizziness.  Patient describes dizziness as "spinning" and "off balance".  Patient with disconjugate gaze, making testing difficult.  Focused on Rt eye, patient was able to perform smooth pursuits.  No nystagmus noted at end ranges.  Patient able to perform saccades for 15 seconds in each direction.  More difficulty with horizontal than vertical.  VOR initially positive in both directions.  Both directions test negative with re-test.  Patient continued to close eyes during testing due to increase in dizziness, and feeling "bad".  Unable to perform Dix-Hallpike testing today.  At this point, do not feel patient will be able to return home safely - unable to ambulate.  Patient with very limited assist at home.  Would recommend Inpatient Rehab consult to return patient to optimal functional level to return home with children.  With inconsistencies between PT and OT sessions, and comments about home stressors, would also recommend SW consult/Psych consult to address these issues - could occur on CIR.   Follow Up Recommendations   CIR;Supervision/Assistance - 24 hour     Equipment Recommendations  Wheelchair (measurements PT);Wheelchair cushion (measurements PT);Rolling walker with 5" wheels;3in1 (PT)    Recommendations for Other Services Rehab consult     Precautions / Restrictions Precautions Precautions: Fall Restrictions Weight Bearing Restrictions: No    Mobility  Bed Mobility Overal bed mobility: Needs Assistance Bed Mobility: Supine to Sit;Sit to Supine     Supine to sit: Min guard Sit to supine: Min guard   General bed mobility comments: No physical assist required from PT.  Assist for balance/safety due to dizziness.  Patient did use UE's to assist RLE onto bed to return to supine.  Transfers Overall transfer level: Needs assistance Equipment used: Rolling walker (2 wheeled) Transfers: Sit to/from Stand Sit to Stand: Min assist         General transfer comment: Verbal cues for hand placement.  Required assist to power up to stance, and for balance due to dizziness in stance.  Noted LE's shaking with decreased stability in stance.  Patient stood x2.  Ambulation/Gait             General Gait Details: Patient attempted to take steps with RW.  LE's too unstable in stance to safely take steps.  Returned to sitting.   Stairs            Wheelchair Mobility    Modified Rankin (Stroke Patients Only)       Balance Overall balance assessment: Needs assistance Sitting-balance support: No upper extremity supported;Feet supported Sitting balance-Leahy Scale: Fair Sitting balance - Comments: Patient sits on hands "for better balance"   Standing balance support:  Bilateral upper extremity supported Standing balance-Leahy Scale: Poor Standing balance comment: Dizziness impacting balance                            Cognition Arousal/Alertness: Awake/alert Behavior During Therapy: WFL for tasks assessed/performed Overall Cognitive Status: History of cognitive  impairments - at baseline Area of Impairment: Attention;Memory                   Current Attention Level: Selective Memory: Decreased short-term memory         General Comments: Patient had BI in 2008 with resulting decrease in memory and attention.  Also impacted vision - "I don't see out of my Lt eye unless I close my Rt eye".        Exercises General Exercises - Lower Extremity Long Arc Quad: AROM;Both;5 reps;Seated Straight Leg Raises: AROM;Both;5 reps;Supine Hip Flexion/Marching: AROM;Both;5 reps;Seated Toe Raises: AROM;Both;5 reps;Seated Heel Raises: AROM;Both;5 reps;Seated    General Comments General comments (skin integrity, edema, etc.): Eyes are disconjugate, with Lt eye in more adducted position.  Patient reports she sees from Rt eye primarily, with minimal diplopia.      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 : A Little Difficulty sitting down on and standing up from a  chair with arms (e.g., wheelchair, bedside commode, etc,.)?: Unable Help needed moving to and from a bed to chair (including a wheelchair)?: A Lot Help needed walking in hospital room?: A Lot Help needed climbing 3-5 steps with a railing? : Total 6 Click Score: 13    End of Session Equipment Utilized During Treatment: Gait belt Activity Tolerance: Patient limited by fatigue(Limited by dizziness) Patient left: in bed;with call bell/phone within reach;with bed alarm set Nurse Communication: Mobility status PT Visit Diagnosis: Unsteadiness on feet (R26.81);Difficulty in walking, not elsewhere classified (R26.2);Dizziness and giddiness (R42);Muscle weakness (generalized) (M62.81)     Time: 1613-1700 PT Time Calculation (min) (ACUTE ONLY): 47 min  Charges:  $Therapeutic Activity: 38-52 mins                    G Codes:       Carita Pian. Sanjuana Kava, [facility] Acute Rehab Services Pager [place] 03/09/2017, 6:49 PM

## 2017-03-09 NOTE — Progress Notes (Addendum)
Occupational Therapy Evaluation Patient Details Name: Caitlin Glass MRN: [id number] DOB: 10-28-1980 Today's Date: 03/09/2017    History of Present Illness 37 y.o. female presenting with right-sided facial numbness, dizziness, and right-sided HA. CT, MRI, and chest X-ray negative. EEG pending. Recent admittion to [facility] (12/20) with pt reported diagnosis of TIA. During outpatient neuro follow up visit, reported to have 3 small seizures. PMH significant of TBI, anxiety, and siezures.    Clinical Impression   PTA, pt lived at home with her 3 children (12,10 & 4), with her 2 oldest children being autistic. Pt works at her [employer] and is in school part time. Pt verbalizes life stressors and states "I was trying to tell them that I'm not having seizures but this is how I react to stress and it increases my panic attacks".  Pt appears to have limited caregiver support in addition to life stressors and states "my parents want their house back so now I'm goin g to have to find another place to live"  Pt currently requires Min A for ADL and mobility due to "feeling the room is spinning" but states " I just have to go home and take care of everything". Pt unable to ambulate at this time, however she did not demonstrate focal R sided weakness or complain of her R side feeling weak. . Feel pt would benefit from a psych consult given her inconsistent performance between PT and OT today. In order to DC home, pt will need to demonstrate the ability to complete ADL and mobility at a modified independent level. Pt may require CIR to facilitate a safe return home. Will follow acutely to address established goals and address an appropriate DC plan. Recommend SW consult for counseling and assess use of any available community resources to help pt.     Follow Up Recommendations  Supervision - Intermittent; DC plan TBD pending progress; may need [facility]  consult Psych Consult   Equipment Recommendations  3 in 1 bedside commode    Recommendations for Other Services       Precautions / Restrictions Precautions Precautions: Fall Restrictions Weight Bearing Restrictions: No      Mobility Bed Mobility               General bed mobility comments: Pt sitting EOB with NT on entry  Transfers Overall transfer level: Needs assistance Equipment used: None Transfers: Sit to/from Stand Sit to Stand: Min assist         General transfer comment: attempts x 3. states she feels room is spinning and her HR increases (HR increases to 124 with standing)    Balance Overall balance assessment: Needs assistance Sitting-balance support: No upper extremity supported;Feet supported Sitting balance-Leahy Scale: Fair Sitting balance - Comments: Pt able to sit EOB without external support, however demonstrates slight swaying and c/o room spinning.      Standing balance-Leahy Scale: Poor Standing balance comment: Pt requiring assist for standing static balance, as pt demonstrates swaying and dizziness.                           ADL either performed or assessed with clinical judgement   ADL Overall ADL's : Needs assistance/impaired Eating/Feeding: Independent   Grooming: Set up;Sitting   Upper Body Bathing: Set up;Sitting   Lower Body Bathing: Min guard;Sit to/from stand   Upper Body Dressing : Set up;Sitting   Lower Body Dressing: Minimal assistance;Sit to/from  stand   Toilet Transfer: Minimal assistance;BSC;Stand-pivot Armed forces technical officer Details (indicate cue type and reason): unsteady during transfer requiring need to sit and attempt x 3 Toileting- Clothing Manipulation and Hygiene: Minimal assistance       Functional mobility during ADLs: Minimal assistance General ADL Comments: Pt complaining of room "spinning"Pt discussing her life stressors regarding her home situation; staes "as soon as I get home I know that it  will all start again"     Vision Baseline Vision/History: Wears glasses       Perception     Praxis      Pertinent Vitals/Pain Pain Assessment: No/denies pain; HR 80 sitting 124 standing     Hand Dominance Right   Extremity/Trunk Assessment Upper Extremity Assessment Upper Extremity Assessment: Overall WFL for tasks assessed; no complaints of R sided weakness   Lower Extremity Assessment Lower Extremity Assessment: Defer to PT evaluation   Cervical / Trunk Assessment Cervical / Trunk Assessment: Normal   Communication Communication Communication: No difficulties   Cognition Arousal/Alertness: Awake/alert Behavior During Therapy: WFL for tasks assessed/performed Overall Cognitive Status: History of cognitive impairments - at baseline Area of Impairment: Attention                   Current Attention Level: Selective           General Comments: Pt staes she has poor STM and attention form a TBI ( p 35# pot fell on her head in 2008).    General Comments       Exercises     Shoulder Instructions      Home Living Family/patient expects to be discharged to:: Private residence Living Arrangements: Children(takes care of 3 children (4, 12, and ?)) Available Help at Discharge: Family;Available PRN/intermittently Type of Home: House Home Access: Stairs to enter [employer] of Steps: 5 Entrance Stairs-Rails: Right;Left Home Layout: One level     Bathroom Shower/Tub: Teacher, early years/pre: Standard Bathroom Accessibility: No   Home Equipment: None      Lives With: Family(3 children - 2 oldest are autistic)    Prior Functioning/Environment Level of Independence: Independent        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        OT Problem List: Decreased strength;Decreased activity tolerance;Impaired balance (sitting and/or standing);Decreased safety awareness;Decreased knowledge of use of DME or  AE;Obesity      OT Treatment/Interventions: Self-care/ADL training;DME and/or AE instruction;Therapeutic activities;Patient/family education;Balance training    OT Goals(Current goals can be found in the care plan section) Acute Rehab OT Goals Patient Stated Goal: to reduce symptoms OT Goal Formulation: With patient Time For Goal Achievement: 03/23/17 Potential to Achieve Goals: Good  OT Frequency: Min 2X/week   Barriers to D/C: Decreased caregiver support          Co-evaluation              AM-PAC PT "6 Clicks" Daily Activity     Outcome Measure Help from another person eating meals?: None Help from another person taking care of personal grooming?: None Help from another person toileting, which includes using toliet, bedpan, or urinal?: None Help from another person bathing (including washing, rinsing, drying)?: A Little Help from another person to put on and taking off regular upper body clothing?: None Help from another person to put on and taking off regular lower body clothing?: A Little 6 Click Score: 22   End of Session Nurse Communication: Mobility status  Activity Tolerance: Patient tolerated treatment well Patient left: in bed;with call bell/phone within reach;with bed alarm set  OT Visit Diagnosis: Unsteadiness on feet (R26.81);Muscle weakness (generalized) (M62.81);Dizziness and giddiness (R42)                Time: 7903-8333 OT Time Calculation (min): 23 min Charges:  OT General Charges $OT Visit: 1 Visit OT Evaluation $OT Eval Moderate Complexity: 1 Mod OT Treatments $Self Care/Home Management : 8-22 mins G-Codes:     [facility], OT/L  [phone] 03/09/2017  Ardean Melroy,[place] 03/09/2017, 4:42 PM

## 2017-03-09 NOTE — Progress Notes (Signed)
LTM discontinued; no skin breakdown was seen. Dr Candiss Norse notified.

## 2017-03-09 NOTE — Procedures (Signed)
Electroencephalogram report- LTM  Ordering Physician : Dr. Cheral Marker EEG number: [phone]    Beginning date or time: 03/08/2017 10AM Ending date or time: 03/09/2017 7:30AM  Day of study: Day 1  Medications include: Per EMR  MENTAL STATUS (per technician's notes): Alert.Follows simple commands.  HISTORY: This 24 hours of intensive EEG monitoring with simultaneous video monitoring was performed for this patient with seizure like episodes. This EEG was requested to characterize the episodes and guide medical management.  TECHNICAL DESCRIPTION:  The study consists of a continuous 16-channel multi-montage digital video EEG recording with twenty-one electrodes placed according to the International 10-20 System. Additional leads included eye leads, true temporal leads (T1, T2), and an EKG lead. Activation procedures were not done due to mental status.  REPORT: The background activity in this tracing consists of 9-10 Hz posterior rhythm.  The activity is symmetric bilaterally and reactive to eye opening and stimulation.  Drowsiness and sleep were manifested by bilateral fragmentation, vertex waves and sleep spindles.  No focal slowing or epileptiform activity was seen.  The patient had multiple spells during the recording (1638, 1648, 1730, 20/20, 2238, 0136 hrs.).  The patient was unresponsive to verbal and mild tactile stimuli during these episodes.  There was no stereotypy between the episodes.  In some of the episodes, she had mild side-to-side head shaking with some arrhythmic extremity movements. Electrographically, normal awake background was seen, with no underlying epileptiform activity.   IMPRESSION: This EEG shows: No focal or paroxysmal activity  CLINICAL CORRELATION: Based on this video EEG recording, the patient's spells are nonepileptic in nature.  Clinical correlation is required.

## 2017-03-09 NOTE — Progress Notes (Signed)
PT Cancellation Note  Patient Details Name: Caitlin Glass MRN: [id number] DOB: 12-Feb-1981   Cancelled Treatment:    Reason Eval/Treat Not Completed: Patient at procedure or test/unavailable. Patient currently with EEG monitoring in place.  Unable to perform vestibular testing with EEG in place.  Will return at later time for vestibular testing.   Despina Pole 03/09/2017, 11:25 AM Carita Pian. Sanjuana Kava, [place] Pager [phone]

## 2017-03-09 NOTE — Progress Notes (Signed)
OT Cancellation Note  Patient Details Name: Caitlin Glass MRN: [id number] DOB: [birth year]   Cancelled Treatment:    Reason Eval/Treat Not Completed: Other (comment). Continues to be on EEG. Will attempt once able to participate.   [place], OT/L  [phone] 03/09/2017 03/09/2017, 1:23 PM

## 2017-03-09 NOTE — Progress Notes (Addendum)
Subjective: Patient states that she had multiple pushbutton events.  She states that she puts the button when she felt that she was in the" zone" she also states that she was having hot flashes.  Currently she states that she is very stiff because she has not been moving very much.  No other complaints.  Exam: Vitals:   03/09/17 0057 03/09/17 0542  BP: 103/76 112/71  Pulse: 96 94  Resp: 18 18  Temp: 97.7 F (36.5 C) 97.8 F (36.6 C)  SpO2: 100% 99%    HEENT-  Normocephalic, no lesions, without obvious abnormality.  Normal external eye and conjunctiva.  Normal TM's bilaterally.  Normal auditory canals and external ears. Normal external nose, mucus membranes and septum.  Normal pharynx. Cardiovascular- S1, S2 normal, pulses palpable throughout   Lungs- chest clear, no wheezing, rales, normal symmetric air entry Abdomen- normal findings: bowel sounds normal Extremities- no edema Lymph-no adenopathy palpable Musculoskeletal-no joint tenderness, deformity or swelling Skin-warm and dry, no hyperpigmentation, vitiligo, or suspicious lesions   Neuro:  CN: Pupils are equal and round. They are symmetrically reactive from 3-->2 mm. EOMI without nystagmus. Facial sensation is intact to light touch. Face is symmetric at rest with normal strength and mobility. Hearing is intact to conversational voice. Palate elevates symmetrically and uvula is midline. Voice is normal in tone, pitch and quality. Bilateral SCM and trapezii are 5/5. Tongue is midline with normal bulk and mobility.  Motor:  Moving all extremities antigravity.  Sensation: Intact to light touch.  DTRs: 1+, symmetric  Toes downgoing bilaterally. No pathologic reflexes.  Coordination: Finger-to-nose    Medications:  Scheduled: . amoxicillin-clavulanate  1 tablet Oral Q12H  . enoxaparin (LOVENOX) injection  40 mg Subcutaneous Q24H  . levETIRAcetam  500 mg Oral BID  . LORazepam  1 mg Intravenous Once   Continuous: . sodium  chloride 100 mL/hr at 03/09/17 0114   LKG:[id number] **OR** acetaminophen (TYLENOL) oral liquid 160 mg/5 mL **OR** acetaminophen, LORazepam, ondansetron (ZOFRAN) IV, senna-docusate  Pertinent Labs/Diagnostics: LTM: The patient had multiple spells during the recording (1638, 1648, 1730, 20/20, 2238, 0136 hrs.).  The patient was unresponsive to verbal and mild tactile stimuli during these episodes.  There was no stereotypy between the episodes.  In some of the episodes, she had mild side-to-side head shaking with some arrhythmic extremity movements. Electrographically, normal awake background was seen, with no underlying epileptiform activity.  Echocardiogram: Left ventricular cavity size was normal with an EF of 60% to 65%.  Wall motion was within normal limits.  LDL 82 with A1c being 5.2  No results found.   Etta Quill PA-C [facility] [phone]   NEUROHOSPITALIST ADDENDUM Seen and examined the patient today.  ASSESSMENT AND PLAN   Caitlin Glass a 37 y.o.femalewith medical history significant forTBI, anxiety, seizures who was sent from neurology clinic due to multiple neurologic complaints x 2weeks  including R sided parietal headache,shock-like in character, R face numbness and vertigo on sitting/standing up. She was recently admitted in [facility] and diagnosed TIA,  and had a seizure like spell and started on Keppra.   Following admission to [facility], she has had MRI brain with and without contrast negative for acute stroke, MS or any organic lesions. MR venogram negative for sinus venous thrombosis and CTA negative for dissection. Patient started on Augmentin recently for ? +BC and leukocytosis, also UDS + for THC.  She also had seizure like activity while admitted and placed on  LTM.  LTM captured multiple spells and whih were non epileptic.   Plan  Vertigo/Tinnitus? Orthostatics, please evaluate for POTS syndrome ? 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 Background EEG shows no epileptiform activity.  Please stop Keppra No driving until cleared by outpatient neurologist/dizzy spells controlled.   Headache/shooting pain/right-sided weakness Improvement with migraine cocktail Extensive workup including MRI brain with and without contrast, MR venogram, CTA head and neck negative  Anxiety : Xanax PRN during admission, considered SSRI however may cause dizziness. May benefit from psych consult.  Leukocytosis: unclear etiology     Sandeep Radell MD [facility] [phone]  If 7pm to 7am, please call on call as listed on AMION.    Recommendations: 1) Per [facility] statutes, patients with seizures are not allowed to drive until  they have been seizure-free for six months. Use caution when using heavy equipment or power tools. Avoid working on ladders or at heights. Take showers instead of baths. Ensure the water temperature is not too high on the home water heater. Do not go swimming alone. When caring for infants or small children, sit down when holding, feeding, or changing them to minimize risk of injury to the child in the event you have a seizure.   Also, Maintain good sleep hygiene. Avoid alcohol.  --> Call 911 and bring the patient back to the ED if:                   A.  The seizure lasts longer than 5 minutes.                  B.  The patient doesn't awaken shortly after the seizure             C.  The patient has new problems such as difficulty seeing, speaking or moving             D.  The patient was injured during the seizure             E.  The patient has a temperature over 102 F (39C)             F.  The patient vomited and now is having trouble breathing    03/09/2017, 9:38 AM

## 2017-03-09 NOTE — Progress Notes (Signed)
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  Patient's of 7, bipolar, seizures admitted from neurology clinic secondary to right parietal headache shocklike in character right facial numbness and a feeling of being trapped on the right side of her body  had been discharged with a TIA on aspirin as well as Keppra-had an MRI in the emergency room with seizures and was discharged with Toradol and Reglan  Assessment & Plan:   Assessment:  The primary encounter diagnosis was Paresthesias. Diagnoses of New onset of headaches and Leukocytosis, unspecified type were also pertinent to this visit.  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 Right-sided headache facial numbness?  Complex migraine-MRI CT angiogram and other studies all negative--will d/c Keppra as per Neuro, Get Vestib rehab to see again-she has 2 special needs children at home and has no outside support, feel we need safe plan and documentation of safe ambulation prior to d/c home Echocardiogram EF 60%. Dizziness and tinnitus-no orthostatic findings, get vestibular rehab. Leukocytosis etiology unclear urine culture negative blood culture X 2- pro calcitonin is less than 0.10 Bipolar-she has not been on any PTA meds-I feel she should be on a mood stabilizer going forward.  Will monitor   DVT prophylaxis: lovenox Code Status: full Family Communication:  noneDisposition Plan:  Inpatient pedning safe planning for d/c home   Consultants:   neuro  Procedures:   multiple  Antimicrobials:   none   Subjective:  Awake alert pleasant in nad but anxious and tearful tol po no cp no  fever no chills   Objective: Vitals:   03/08/17 1648 03/08/17 2133 03/09/17 0057 03/09/17 0542  BP: 134/82 111/73 103/76 112/71  Pulse: 90 82 96 94  Resp: 18 18 18 18   Temp: (!) 97.5 F (36.4 C) 97.7 F (36.5 C) 97.7 F (36.5 C) 97.8 F (36.6 C)  TempSrc: Oral Oral Oral Oral  SpO2: 100% 99% 100% 99%  Weight:      Height:        Intake/Output Summary (Last 24 hours) at 03/09/2017 0730 Last data filed at 03/09/2017 0300 Gross per 24 hour  Intake 1408.34 ml  Output 400 ml  Net 1008.34 ml   Filed Weights   03/06/17 1240  Weight: 117.5 kg (259 lb)    Examination: eomi ncat s1s2 no mrg abd soft nt nd no rebound no guard Le not weake moving all 4 extremities equally No rash-tattoos  Data Reviewed: I have personally reviewed following labs and imaging studies  CBC: Recent Labs  Lab 03/02/17 1406 03/06/17 1358 03/06/17 1448 03/07/17 0407 03/08/17 0459  WBC 18.0* 16.3*  --  12.2* 14.6*  NEUTROABS 13.8*  --   --  8.1* 9.9*  HGB 14.4 14.7 15.3* 13.7 14.6  HCT 43.1 43.8 45.0 41.3 42.4  MCV 84.5 83.4  --  83.9 83.3  PLT 527* 498*  --  489* 174*   Basic Metabolic Panel: Recent Labs  Lab 03/02/17 1406 03/06/17 1358 03/06/17 1448 03/07/17 0407 03/08/17 1045  NA 139 138 139 137 136  K 4.0 3.9 3.9 3.8 3.9  CL 102 104 102 100* 103  CO2 24 25  --  25 25  GLUCOSE 88 90 89 85 108*  BUN 8 8 9 10 9   CREATININE 0.84 0.79 0.70 0.90 0.78  CALCIUM 9.4 9.3  --  9.4 9.2   GFR: Estimated Creatinine Clearance: 124.6 mL/min (by C-G formula based on SCr of 0.78 mg/dL). Liver Function Tests: No results for input(s): AST, ALT, ALKPHOS, BILITOT, PROT, ALBUMIN in the last 168 hours. No results for input(s): LIPASE, AMYLASE in the last 168 hours. No results for input(s): AMMONIA in the last 168 hours. Coagulation Profile: No results for input(s): INR, PROTIME in the last 168 hours. Cardiac Enzymes: Recent Labs  Lab 03/07/17 2108  TROPONINI <0.03   CBG: Recent Labs  Lab  03/02/17 1536 03/07/17 0940 03/07/17 2135  GLUCAP 89 90 85   Urine analysis:    Component Value Date/Time   COLORURINE YELLOW 03/07/2017 0314   APPEARANCEUR HAZY (A) 03/07/2017 0314   LABSPEC >1.046 (H) 03/07/2017 0314   PHURINE 5.0 03/07/2017 0314   GLUCOSEU NEGATIVE 03/07/2017 0314   HGBUR NEGATIVE 03/07/2017 0314   BILIRUBINUR NEGATIVE 03/07/2017 0314   KETONESUR NEGATIVE 03/07/2017 0314   PROTEINUR NEGATIVE 03/07/2017 0314   NITRITE NEGATIVE 03/07/2017 0314   LEUKOCYTESUR NEGATIVE 03/07/2017 0314     Radiology Studies: Reviewed images personally in health database    Scheduled Meds: . amoxicillin-clavulanate  1 tablet Oral Q12H  . enoxaparin (LOVENOX) injection  40 mg Subcutaneous Q24H  . levETIRAcetam  500 mg Oral BID  . LORazepam  1 mg Intravenous Once   Continuous Infusions: . sodium chloride 100 mL/hr at 03/09/17 0114     LOS: 2 days    Time spent: [place], MD [facility]   If 7PM-7AM, please contact night-coverage [email or web address] Password [email or web address] 03/09/2017, 7:30 AM

## 2017-03-09 NOTE — Progress Notes (Signed)
Pt found trying to get out of bed, tearing IV and EEG leads off. Pt crying stating, "nobody was listening to her." The pt. Calmed down and eventually got back to bed but stated she felt like ripping everything off.  MD is aware and followed up.  Will continue to monitor.

## 2017-03-10 DIAGNOSIS — R200 Anesthesia of skin: Secondary | ICD-10-CM

## 2017-03-10 DIAGNOSIS — R569 Unspecified convulsions: Secondary | ICD-10-CM

## 2017-03-10 DIAGNOSIS — F411 Generalized anxiety disorder: Principal | ICD-10-CM

## 2017-03-10 DIAGNOSIS — R450 Nervousness: Secondary | ICD-10-CM

## 2017-03-10 DIAGNOSIS — G4700 Insomnia, unspecified: Secondary | ICD-10-CM

## 2017-03-10 DIAGNOSIS — Z813 Family history of other psychoactive substance abuse and dependence: Secondary | ICD-10-CM

## 2017-03-10 DIAGNOSIS — F0781 Postconcussional syndrome: Secondary | ICD-10-CM

## 2017-03-10 DIAGNOSIS — H8111 Benign paroxysmal vertigo, right ear: Secondary | ICD-10-CM

## 2017-03-10 DIAGNOSIS — Z818 Family history of other mental and behavioral disorders: Secondary | ICD-10-CM

## 2017-03-10 LAB — PROCALCITONIN: Procalcitonin: <0.1 ng/mL

## 2017-03-10 LAB — TSH: TSH: 2.291 u[IU]/mL (ref 0.350–4.500)

## 2017-03-10 MED ORDER — METOPROLOL SUCCINATE ER 25 MG PO TB24
12.5000 mg | ORAL_TABLET | Freq: Every day | ORAL | Status: DC
  Administered 2017-03-10 – 2017-03-13 (×4): 12.5 mg via ORAL
  Filled 2017-03-10 (×4): qty 1

## 2017-03-10 MED ORDER — HYDROXYZINE HCL 25 MG PO TABS
25.0000 mg | ORAL_TABLET | Freq: Three times a day (TID) | ORAL | Status: DC | PRN
  Administered 2017-03-10 – 2017-03-13 (×6): 25 mg via ORAL
  Filled 2017-03-10 (×6): qty 1

## 2017-03-10 MED ORDER — TRAZODONE HCL 50 MG PO TABS
25.0000 mg | ORAL_TABLET | Freq: Every day | ORAL | Status: DC
  Administered 2017-03-10 – 2017-03-12 (×3): 25 mg via ORAL
  Filled 2017-03-10 (×3): qty 1

## 2017-03-10 NOTE — Progress Notes (Signed)
Hospitalist progress note         Caitlin Glass  XBM:[id number] DOB: 1980-05-22 DOA: 03/06/2017 PCP: [facility]   Specialists:   Brief Narrative:  37 year old female prior TBI secondary to concussion when the plantar fell on her head and knocked her out resulting in left IE Citro p.m.  Patient's of 7, bipolar, seizures admitted from neurology clinic secondary to right parietal headache shocklike in character right facial numbness and a feeling of being trapped on the right side of her body  had been discharged with a TIA on aspirin as well as Keppra-had an MRI in the emergency room with seizures and was discharged with Toradol and Reglan  Assessment & Plan:   Assessment:  The primary encounter diagnosis was Paresthesias. Diagnoses of New onset of headaches and Leukocytosis, unspecified type were also pertinent to this visit.  Not Seizure-probable BPPV, orthostatics neg-continue IV Ativan as needed-have asked Psychiatry also to see in hospital as some somatatization noted  Right-sided headache facial numbness?  Complex migraine-MRI CT angiogram and other studies all negative--will d/c Keppra as per Neuro--asking psych to see-would do well on CIR--not stable on her feet per therapy-consult requested Dizziness and tinnitus-no orthostatic findings Mod-severe anxiety-tearful eysertday-some improvement today Leukocytosis etiology unclear urine culture negative blood culture X 2- pro calcitonin is less than 0.10 Bipolar-she has not been on any PTA meds-I feel she should be on a mood stabilizer going forward.    DVT prophylaxis: lovenox Code Status: full Family Communication:  None Disposition Plan:  Likely CIR if agreeable   Consultants:   neuro  Procedures:   multiple  Antimicrobials:   none   Subjective:  Much more awake alert in nad No fever n/vcp Eating drinking  Still feels a little "off"  Objective: Vitals:   03/09/17 2100 03/10/17 0035 03/10/17  0457 03/10/17 0904  BP: 111/72 114/62 (!) 106/56 114/66  Pulse: 89 76 82 75  Resp: 20 20 18 20   Temp: 97.9 F (36.6 C) (!) 97.4 F (36.3 C) 97.8 F (36.6 C) 97.7 F (36.5 C)  TempSrc: Oral Oral Oral Oral  SpO2: 98% 100% 100% 100%  Weight:      Height:        Intake/Output Summary (Last 24 hours) at 03/10/2017 1308 Last data filed at 03/10/2017 0904 Gross per 24 hour  Intake 120 ml  Output 1 ml  Net 119 ml   Filed Weights   03/06/17 1240  Weight: 117.5 kg (259 lb)    Examination: eomi ncat,CN grossly intact s1s2 no mrg abd soft nt nd no rebound no guard Le not weaker moving all 4 extremities equally-smile symm, no focaldeficit No rash-tattoos  Data Reviewed: I have personally reviewed following labs and imaging studies  CBC: Recent Labs  Lab 03/06/17 1358 03/06/17 1448 03/07/17 0407 03/08/17 0459 03/09/17 0837  WBC 16.3*  --  12.2* 14.6* 13.5*  NEUTROABS  --   --  8.1* 9.9* 10.0*  HGB 14.7 15.3* 13.7 14.6 13.5  HCT 43.8 45.0 41.3 42.4 40.4  MCV 83.4  --  83.9 83.3 84.0  PLT 498*  --  489* 507* 027*   Basic Metabolic Panel: Recent Labs  Lab 03/06/17 1358 03/06/17 1448 03/07/17 0407 03/08/17 1045 03/09/17 0837  NA 138 139 137 136 134*  K 3.9 3.9 3.8 3.9 3.6  CL 104 102 100* 103 101  CO2 25  --  25 25 25   GLUCOSE 90 89 85 108* 137*  BUN 8  9 10 9 8   CREATININE 0.79 0.70 0.90 0.78 0.78  CALCIUM 9.3  --  9.4 9.2 9.0   GFR: Estimated Creatinine Clearance: 124.6 mL/min (by C-G formula based on SCr of 0.78 mg/dL). Liver Function Tests: No results for input(s): AST, ALT, ALKPHOS, BILITOT, PROT, ALBUMIN in the last 168 hours. No results for input(s): LIPASE, AMYLASE in the last 168 hours. No results for input(s): AMMONIA in the last 168 hours. Coagulation Profile: No results for input(s): INR, PROTIME in the last 168 hours. Cardiac Enzymes: Recent Labs  Lab 03/07/17 2108  TROPONINI <0.03   CBG: Recent Labs  Lab 03/07/17 0940 03/07/17 2135  GLUCAP  90 85   Urine analysis:    Component Value Date/Time   COLORURINE YELLOW 03/07/2017 0314   APPEARANCEUR HAZY (A) 03/07/2017 0314   LABSPEC >1.046 (H) 03/07/2017 0314   PHURINE 5.0 03/07/2017 0314   GLUCOSEU NEGATIVE 03/07/2017 0314   HGBUR NEGATIVE 03/07/2017 0314   BILIRUBINUR NEGATIVE 03/07/2017 0314   KETONESUR NEGATIVE 03/07/2017 0314   PROTEINUR NEGATIVE 03/07/2017 0314   NITRITE NEGATIVE 03/07/2017 0314   LEUKOCYTESUR NEGATIVE 03/07/2017 0314     Radiology Studies: Reviewed images personally in health database    Scheduled Meds: . enoxaparin (LOVENOX) injection  40 mg Subcutaneous Q24H   Continuous Infusions: . sodium chloride 100 mL/hr at 03/09/17 0114     LOS: 3 days    Time spent: [place], MD [facility]) [phone]   If 7PM-7AM, please contact night-coverage [email or web address] Password [email or web address] 03/10/2017, 1:08 PM

## 2017-03-10 NOTE — Progress Notes (Signed)
Occupational Therapy Treatment Patient Details Name: Caitlin Glass MRN: [id number] DOB: [birth year] Today's Date: 03/10/2017    History of present illness 37 y.o. female presenting with right-sided facial numbness, dizziness, and right-sided HA. CT, MRI, and chest X-ray negative. EEG pending. Recent admittion to [facility] (12/20) with pt reported diagnosis of TIA. During outpatient neuro follow up visit, reported to have 3 small seizures. PMH significant of TBI, anxiety, and siezures.    OT comments  Pt making progress with functional goals, however continues to demonstrated decreased an unsafe balance/mobilioty during functional tasks, R side/UE weakness. OT will continue to follow acutely  Follow Up Recommendations  CIR    Equipment Recommendations  3 in 1 bedside commode    Recommendations for Other Services      Precautions / Restrictions Precautions Precautions: Fall Restrictions Weight Bearing Restrictions: No       Mobility Bed Mobility Overal bed mobility: Modified Independent         Sit to supine: Modified independent (Device/Increase time)   General bed mobility comments: pt on BSC with NT upon OT arrival  Transfers Overall transfer level: Needs assistance Equipment used: Rolling walker (2 wheeled) Transfers: Sit to/from Stand Sit to Stand: Min assist         General transfer comment: v/c's for hand placement/push up from chair, increased time, unstrady with RW    Balance Overall balance assessment: Needs assistance Sitting-balance support: No upper extremity supported;Feet supported Sitting balance-Leahy Scale: Fair     Standing balance support: Bilateral upper extremity supported;During functional activity;Single extremity supported Standing balance-Leahy Scale: Poor Standing balance comment: Dizziness impacting balance                           ADL either performed or assessed with clinical judgement   ADL Overall ADL's : Needs  assistance/impaired     Grooming: Wash/dry hands;Min guard;Standing       Lower Body Bathing: Min guard;Sit to/from stand       Lower Body Dressing: Minimal assistance;Sit to/from stand Lower Body Dressing Details (indicate cue type and reason): pt unsteady donning pants Toilet Transfer: Minimal assistance;Ambulation;RW;BSC Toilet Transfer Details (indicate cue type and reason): unsteady during sit - stand and ambulation from [facility] to recliner using RW Toileting- Clothing Manipulation and Hygiene: Minimal assistance;Min guard;Sit to/from stand         General ADL Comments: Pt complaining of room "spinning". MD in to see pt to ask her how she would like to go to CIR. After MD left room, pt asked OT many questions about CIR     Vision Baseline Vision/History: Wears glasses Patient Visual Report: No change from baseline     Perception     Praxis      Cognition Arousal/Alertness: Awake/alert Behavior During Therapy: WFL for tasks assessed/performed Overall Cognitive Status: History of cognitive impairments - at baseline Area of Impairment: Attention;Memory                   Current Attention Level: Selective Memory: Decreased recall of precautions         General Comments: pt able to follow commands and responsive to recommendations. pt does have histry of BI in 2008        Exercises     Shoulder Instructions       General Comments      Pertinent Vitals/ Pain       Pain Assessment: No/denies pain Pain Score: 0-No pain Pain  Intervention(s): Monitored during session  Home Living Family/patient expects to be discharged to:: Private residence Living Arrangements: Children Available Help at Discharge: Family;Available PRN/intermittently Type of Home: House Home Access: Stairs to enter                                Prior Functioning/Environment              Frequency  Min 2X/week        Progress Toward Goals  OT  Goals(current goals can now be found in the care plan section)  Progress towards OT goals: Progressing toward goals     Plan Discharge plan remains appropriate    Co-evaluation                 AM-PAC PT "6 Clicks" Daily Activity     Outcome Measure   Help from another person eating meals?: None Help from another person taking care of personal grooming?: A Little(standing at sink) Help from another person toileting, which includes using toliet, bedpan, or urinal?: A Little Help from another person bathing (including washing, rinsing, drying)?: A Little Help from another person to put on and taking off regular upper body clothing?: None Help from another person to put on and taking off regular lower body clothing?: A Little 6 Click Score: 20    End of Session Equipment Utilized During Treatment: Gait belt;Rolling walker;Other (comment)(BSC)  OT Visit Diagnosis: Unsteadiness on feet (R26.81);Muscle weakness (generalized) (M62.81);Dizziness and giddiness (R42)   Activity Tolerance Patient tolerated treatment well   Patient Left with call bell/phone within reach;in chair;with chair alarm set   Nurse Communication      Functional Assessment Tool Used: AM-PAC 6 Clicks Daily Activity   Time: 1540-0867 OT Time Calculation (min): 34 min  Charges: OT G-codes **NOT FOR INPATIENT CLASS** Functional Assessment Tool Used: AM-PAC 6 Clicks Daily Activity OT General Charges $OT Visit: 1 Visit OT Treatments $Self Care/Home Management : 8-22 mins $Therapeutic Activity: 8-22 mins     Britt Bottom 03/10/2017, 1:23 PM

## 2017-03-10 NOTE — Progress Notes (Signed)
Reason for consult:   Subjective: Patient in better mood, states she is able to sit for longer times today, States her right ear is ringing.   ROS: negative except above  Examination  Vital signs in last 24 hours: Temp:  [97.4 F (36.3 C)-98.2 F (36.8 C)] 98 F (36.7 C) (01/04 1330) Pulse Rate:  [75-93] 93 (01/04 1330) Resp:  [18-20] 20 (01/04 1330) BP: (106-154)/(56-91) 154/91 (01/04 1330) SpO2:  [96 %-100 %] 99 % (01/04 1330)  General: Not in distress, cooperative CVS: pulse-normal rate and rhythm RS: breathing comfortably Extremities: normal   Neuro: MS: Alert, oriented, follows commands CN: pupils equal and reactive,  EOMI, face symmetric, tongue midline, normal sensation over face, Motor: 5/5 strength in all 4 extremities Reflexes: 2+ bilaterally over patella, biceps, plantars: flexor Coordination: normal Gait: not tested  Basic Metabolic Panel: Recent Labs  Lab 03/06/17 1358 03/06/17 1448 03/07/17 0407 03/08/17 1045 03/09/17 0837  NA 138 139 137 136 134*  K 3.9 3.9 3.8 3.9 3.6  CL 104 102 100* 103 101  CO2 25  --  25 25 25   GLUCOSE 90 89 85 108* 137*  BUN 8 9 10 9 8   CREATININE 0.79 0.70 0.90 0.78 0.78  CALCIUM 9.3  --  9.4 9.2 9.0    CBC: Recent Labs  Lab 03/06/17 1358 03/06/17 1448 03/07/17 0407 03/08/17 0459 03/09/17 0837  WBC 16.3*  --  12.2* 14.6* 13.5*  NEUTROABS  --   --  8.1* 9.9* 10.0*  HGB 14.7 15.3* 13.7 14.6 13.5  HCT 43.8 45.0 41.3 42.4 40.4  MCV 83.4  --  83.9 83.3 84.0  PLT 498*  --  489* 507* 463*     Coagulation Studies: No results for input(s): LABPROT, INR in the last 72 hours.  Imaging Reviewed: none     ASSESSMENT AND PLAN   Caitlin Glass a 36 y.o.femalewith medical history significant forTBI, anxiety, seizures who was sent from neurology clinic due to multiple neurologic complaints x 2weeks  including R sided parietal headache,shock-like in character, R face numbnessand vertigo on sitting/standing up.  She was recently admitted in [facility] and diagnosed TIA,  and had a seizure like spell and started on Keppra.   Following admission to [facility], she has had MRI brain with and without contrast negative for acute stroke, MS or any organic lesions. MR venogram negative for sinus venous thrombosis and CTA negative for dissection.Patient started on Augmentin recently for ? +BC and leukocytosis, also UDS + for THC.  She also had seizure like activity while admitted and placed on LTM.  LTM captured multiple spells and whih were non epileptic.    Vertigo/ Possible POTS syndrome  Orthostatic vitals signs : 35 increase in HR from sitting to standing position ( 99 to 135) with absence of orthostatic hypotension Previously on metoprolol for sinus tachycardia - Cardiology recommended restarting metoprolol  ENT  Consult - now has tinnitus, PMR attending noted nystagmus on turning to right not appreciable on my exam  Autonomic function tests/Tilt table testing  as outpatient  Vestibular rehab  Non epileptic spells ( previously called pseudoseizures) Multiple spells captured on EEG- there are nonepileptic. Background EEG shows no epileptiform activity.  Please stop Keppra No driving until cleared by outpatient neurologist/dizzy spells controlled.   Headache/shooting pain/right-sided weakness Improvement with migraine cocktail Extensive workup including MRI brain with and without contrast, MR venogram, CTA head and neck negative  Anxiety : Xanax PRN during admission, considered SSRI however may  cause dizziness. May benefit from psych consult.  Leukocytosis: unclear etiology     Sushanth Aroor MD [facility] [phone]  If 7pm to 7am, please call on call as listed on AMION.

## 2017-03-10 NOTE — Care Management Note (Signed)
Case Management Note  Patient Details  Name: Caitlin Glass MRN: [id number] Date of Birth: 01/29/1981  Subjective/Objective:      Pt in with numbness. She is from home with family.              Action/Plan: PT/OT recommending CIR. CM following for d/c disposition.  Expected Discharge Date:                  Expected Discharge Plan:  [place]  In-House Referral:     Discharge planning Services  CM Consult  Post Acute Care Choice:    Choice offered to:     DME Arranged:    DME Agency:     HH Arranged:    [place] Agency:     Status of Service:  In process, will continue to follow  If discussed at Long Length of Stay Meetings, dates discussed:    Additional Comments:  Pollie Friar, RN 03/10/2017, 2:36 PM

## 2017-03-10 NOTE — Consult Note (Signed)
Physical Medicine and Rehabilitation Consult   Reason for Consult: Functional deficits due to dizziness and weakness Referring Physician: Dr. Verlon Au.    HPI: Caitlin Glass is a 37 y.o. female with history of TBI, bipolar disorder, seizures, multiple neurologic symptoms with negative work up at [facility] last month--d/c on ASA and Keppra. She was admitted via neurology office on 03/06/17 with ongoing issues with dizziness,  right parietal headaches with right facial paraesthesias and reports of difficulty moving right side of her body.  Also reports of difficulty keeping food down and 21 lbs weight loss in the past couple of weeks. MRI/MRV negative for acute changes or dural sinus thrombosis. CTA head/neck was negative for stenosis, occlusion or aneurysm. 2 D echo with EF 60-65% with mild TVR.  She had multiple episodes of unresponsives with question of seizure type episodes and LTM EEG done to rule out seizures v/s pseudoseizures (nonepileptic spells). Dr. 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  Therapy ongoing and patient limited by dizziness, weakness and tachycardia. CIR recommended by rehab team.    Review of Systems  Constitutional: Negative for fever.  HENT: Positive for congestion and ear pain. Negative for hearing loss and tinnitus.   Eyes: Positive for blurred vision.  Respiratory: Negative for cough.   Cardiovascular: Negative for chest pain.  Gastrointestinal: Negative for heartburn.  Skin: Negative for rash.  Neurological: Positive for dizziness, tingling, sensory change and headaches.      Past Medical History:  Diagnosis Date  . Anxiety   . Seizures ([place])   . TBI (traumatic brain injury) ([place]) 2008    Past Surgical History:  Procedure Laterality Date  . APPENDECTOMY    . CHOLECYSTECTOMY    . DILATION AND CURETTAGE OF UTERUS    . ESOPHAGOGASTRODUODENOSCOPY ENDOSCOPY      Family History    Problem Relation Age of Onset  . Seizures Mother   . Stroke Father     Social History:  reports that  has never smoked. she has never used smokeless tobacco. She reports that she uses drugs. Drug: Marijuana. She reports that she does not drink alcohol.    Allergies  Allergen Reactions  . Lidocaine Hives  . Tramadol     seizures     Medications Prior to Admission  Medication Sig Dispense Refill  . acetaminophen (TYLENOL) 325 MG tablet Take 650-875 mg by mouth every 6 (six) hours as needed for mild pain.    Marland Kitchen amoxicillin (AMOXIL) 875 MG tablet Take 875 mg by mouth 2 (two) times daily. x 10 days    . aspirin 325 MG tablet Take 325 mg by mouth daily.    Marland Kitchen levETIRAcetam (KEPPRA) 500 MG tablet Take 500 mg by mouth 2 (two) times daily.    . meclizine (ANTIVERT) 25 MG tablet Take 1 tablet (25 mg total) by mouth 3 (three) times daily as needed for dizziness. 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[employer]  [employer] Functional Status:  Mobility: Bed Mobility Overal bed mobility: Modified Independent Bed Mobility: Supine to Sit, Sit to Supine Supine to sit: Min guard Sit to supine: Modified independent (Device/Increase time) General bed mobility comments: pt on BSC with NT upon OT arrival Transfers Overall transfer level: Needs assistance Equipment used: Rolling walker (2 wheeled) Transfers: Sit to/from  Stand Sit to Stand: Min assist General transfer comment: v/c's for hand placement/push up from chair, increased time, unstrady with RW Ambulation/Gait Ambulation/Gait assistance: Min assist, +2 safety/equipment(chair follow) Ambulation Distance (Feet): 10 Feet(x2) Assistive device: Rolling walker (2 wheeled) Gait Pattern/deviations: Step-through pattern, Decreased stride length, Narrow base of support, Decreased dorsiflexion - right General Gait Details: pt with R foot with curled toes, v/c's to relax and place flat. pt reports "my right leg doesn't feel like my leg. Like I think i stepped with it but i didn't" pt becomes very red, shaky and hot. HR increased into 140's, immeadiately returned to 110s upon sitting. Pt becomes slow to respond upon the episode of redness and shakiness. BP 140/101 s/p first amb, 138/116 s/p 2nd bout of ambulation Gait velocity: slow Gait velocity interpretation: Below normal speed for age/gender    ADL: ADL Overall ADL's : Needs assistance/impaired Eating/Feeding: Independent Grooming: Wash/dry hands, Min guard, Standing Upper Body Bathing: Set up, Sitting Lower Body Bathing: Min guard, Sit to/from stand Upper Body Dressing : Set up, Sitting Lower Body Dressing: Minimal assistance, Sit to/from stand Lower Body Dressing Details (indicate cue type and reason): pt unsteady donning pants Toilet Transfer: Minimal assistance, Ambulation, RW, BSC Toilet Transfer Details (indicate cue type and reason): unsteady during sit - stand and ambulation from [facility] to recliner using RW Toileting- Clothing Manipulation and Hygiene: Minimal assistance, Min guard, Sit to/from stand Functional mobility during ADLs: Minimal assistance(sit stand and pivot only) General ADL Comments: Pt complaining of room "spinning". MD in to see pt to ask her how she would like to go to CIR. After MD left room, pt asked OT many questions about CIR  Cognition: Cognition Overall Cognitive Status:  History of cognitive impairments - at baseline Arousal/Alertness: Awake/alert Orientation Level: Oriented X4 Attention: Selective Selective Attention: Appears intact Memory: Impaired Memory Impairment: Retrieval deficit Awareness: Appears intact Problem Solving: Appears intact Safety/Judgment: Appears intact Cognition Arousal/Alertness: Awake/alert Behavior During Therapy: WFL for tasks assessed/performed Overall Cognitive Status: History of cognitive impairments - at baseline Area of Impairment: Attention, Memory Current Attention Level: Selective Memory: Decreased recall of precautions General Comments: pt able to follow commands and responsive to recommendations. pt does have histry of BI in 2008  Blood pressure (!) 154/91, pulse 93, temperature 98 F (36.7 C), temperature source Oral, resp. rate 20, height 5\' 5"  (1.651 m), weight 117.5 kg (259 lb), last menstrual period 02/25/2017, SpO2 99 %. Physical Exam  Constitutional: She appears well-developed. No distress.  obese  HENT:  Head: Normocephalic and atraumatic.  Eyes:  Limited lateral gaze  Cardiovascular: Normal rate.  Respiratory: Effort normal.  GI: Soft.  Neurological:  Disconjugate gaze.  Limited tracking to lateral fields.  Left eye tends to wander medially.  No nystagmus appreciated with tracking.  With turning of head from left to right fields abruptly she experienced vertigo and several beats of lateral nystagmus was seen to the right    Results for orders placed or performed during the hospital encounter of 03/06/17 (from the past 24 hour(s))  Procalcitonin     Status: None   Collection Time: 03/10/17  2:40 AM  Result Value  Ref Range   Procalcitonin <0.10 ng/mL   No results found.  Assessment/Plan: Diagnosis: History of traumatic brain injury/concussion.  Recent seizure.  Presentation most consistent with benign paroxysmal positional vertigo 1. Does the need for close, 24 hr/day medical supervision in  concert with the patient's rehab needs make it unreasonable for this patient to be served in a less intensive setting? No and Potentially 2. Co-Morbidities requiring supervision/potential complications:   3. Due to bladder management, bowel management, safety, skin/wound care, disease management, medication administration, pain management and patient education, does the patient require 24 hr/day rehab nursing? Potentially 4. Does the patient require coordinated care of a physician, rehab nurse, PT (1-2 hrs/day, 5 days/week) and OT (1-2 hrs/day, 5 days/week) to address physical and functional deficits in the context of the above medical diagnosis(es)? No and Potentially Addressing deficits in the following areas: balance, endurance, locomotion, strength, transferring, bowel/bladder control, bathing, dressing, feeding, grooming and psychosocial support 5. Can the patient actively participate in an intensive therapy program of at least 3 hrs of therapy per day at least 5 days per week? Potentially 6. The potential for patient to make measurable gains while on inpatient rehab is fair 7. Anticipated functional outcomes upon discharge from inpatient rehab are tbd  with PT, tbd with OT, tbd with SLP. 8. Estimated rehab length of stay to reach the above functional goals is: tbd 9. Anticipated D/C setting: Home 10. Anticipated post D/C treatments: [facility] therapy 11. Overall Rehab/Functional Prognosis: excellent  RECOMMENDATIONS: This patient's condition is appropriate for continued rehabilitative care in the following setting: [facility] Therapy Patient has agreed to participate in recommended program. Potentially Note that insurance prior authorization may be required for reimbursement for recommended care.  Comment: Patient needs in depth vestibular assessment and treatment by PT as I believe her symptoms are largely peripheral and treatable.  There may be a psychosomatic component to her complaints as well.  She  definitely does want to return home as soon as possible.. If deficits are persistent would consider a rehab admission potentially  Meredith Staggers, MD, [place] 03/10/2017    Bary Leriche, PA-C 03/10/2017

## 2017-03-10 NOTE — Progress Notes (Signed)
Patient is complaining of tinnitus in right ear says it started when her other symptoms started and she would like to assessed for possible inner ear disturbance.

## 2017-03-10 NOTE — Progress Notes (Signed)
Inpatient Rehabilitation  Per OT/PT request, patient was screened by Gunnar Fusi for appropriateness for an Inpatient Acute Rehab consult.  At this time we are recommending an Inpatient Rehab consult.  Text paged MD to notify; please order if you are agreeable.    Carmelia Roller., CCC/SLP Admission Coordinator  [place]  Cell [phone]

## 2017-03-10 NOTE — Consult Note (Signed)
[facility] Consult   Reason for Consult:  Anxiety Referring Physician:  Dr. Verlon Au Patient Identification: Caitlin Glass MRN:  [id number] Principal Diagnosis: GAD (generalized anxiety disorder) Diagnosis:   Patient Active Problem List   Diagnosis Date Noted  . TIA (transient ischemic attack) [G45.9] 03/06/2017  . Numbness [R20.0] 03/06/2017    Total Time spent with patient: 1 hour  Subjective:   Caitlin Glass is a 37 y.o. female patient admitted with seizure like episodes.  HPI:   Per chart review, patient has a history of TBI, seizures and anxiety who was sent from the neurology clinic to the hospital for multiple neurologic complaints. She has exhibited seizure like activity since admission. She completed long term EEG monitoring which showed that her captured spells were nonepileptic. She was recently placed on Keppra while admitted to [facility] (stopped during this admission). She was diagnosed with TIA. Psychiatry was consulted for anxiety. She has received PRN Ativan during her hospital admission. She is recommended to complete inpatient acute rehab.    On interview, Caitlin Glass reports that she was having "head zaps" and right cheek numbness prior to Christmas. She followed up with her outpatient provider and was recommended to be evaluated in the hospital. She was admitted to [facility] for 2 days. They diagnosed TIA. At follow up, her doctor recommended that she go back to the hospital due to concerns for her neurological symptoms and also leucocytosis. 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 She reports several recent stressors. She is a single mother of 6 young children. Her 72 y/o and 26 y/o son have autism. She normally has her 69 y/o child with her since he is not yet in school. She also watches other children. She is helping to care for her mother.  Her parents live in her backyard in an RV trailer since their house is not livable. She has been financially supporting them. She reports that she was doing well but these stressors become more overwhelming over the years and especially now since she is dealing with her health problems. She reports that her mood has been down since her recent health problems. She has not felt supported by her family and has felt like a burden to them. She reports poor sleep, poor appetite and weight loss of 27 pounds since Thanksgiving. She denies SI and reports living for her children. They are her motivation to continue doing well. She additionally reports anxiety since she was a teenager. She has frequent panic attacks that have worsened with recent health stressors. She denies a history of HI or manic symptoms.   Past Psychiatric History: Anxiety  Risk to Self: Is patient at risk for suicide?: No Risk to Others:  None. Denies HI.  Prior Inpatient Therapy:  She was hospitalized 9 years ago for suicide attempt by Lithium overdose. She required her stomach to be pumped. This was during a breakup with her children's father. She reports several hospitalizations during this time. She overdosed at 37 y/o and has a history of cutting.  Prior Outpatient Therapy:  None  Past Medical History:  Past Medical History:  Diagnosis Date  . Anxiety   . Seizures ([place])   . TBI (traumatic brain injury) ([place]) 2008    Past Surgical History:  Procedure Laterality Date  . APPENDECTOMY    . CHOLECYSTECTOMY    . DILATION AND CURETTAGE  OF UTERUS    . ESOPHAGOGASTRODUODENOSCOPY ENDOSCOPY     Family History:  Family History  Problem Relation Age of Onset  . Seizures Mother   . Stroke Father    Family Psychiatric  History: Maternal side-substance abuse; fraternal aunt and cousin-completed suicide and sister-attempted suicide.   Social History:  Social History   Substance and Sexual Activity  Alcohol Use No  . Frequency:  Never     Social History   Substance and Sexual Activity  Drug Use Yes  . Types: Marijuana    Social History   Socioeconomic History  . Marital status: Single    Spouse name: None  . Number of children: None  . Years of education: None  . Highest education level: None  Social Needs  . Financial resource strain: None  . Food insecurity - worry: None  . Food insecurity - inability: None  . Transportation needs - medical: None  . Transportation needs - non-medical: None  Occupational History  . None  Tobacco Use  . Smoking status: Never Smoker  . Smokeless tobacco: Never Used  Substance and Sexual Activity  . Alcohol use: No    Frequency: Never  . Drug use: Yes    Types: Marijuana  . Sexual activity: No  Other Topics Concern  . None  Social History Narrative   Pt lives in 1 story home with her 3 children   Currently in school to obtain her HS diploma   Unemployed   Additional Social History: She lives at home with her children. Her children's father has been intermittently involved with their children due to drug addiction. She reports smoking up to a bowl or marijuana daily until recent health problems. She denies other illicit substance use, alcohol use or tobacco use. 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     Allergies:   Allergies  Allergen Reactions  . Lidocaine Hives  . Tramadol     seizures     Labs:  Results for orders placed or performed during the hospital encounter of 03/06/17 (from the past 48 hour(s))  Prolactin     Status: None   Collection Time: 03/08/17  1:05 PM  Result Value Ref Range   Prolactin 19.2 4.8 - 23.3 ng/mL    Comment: (NOTE) Performed At: [facility] [place], [place] [id number] Rush Farmer MD [id number]   Procalcitonin - Baseline     Status: None   Collection Time: 03/08/17   1:05 PM  Result Value Ref Range   Procalcitonin <0.10 ng/mL    Comment:        Interpretation: PCT (Procalcitonin) <= 0.5 ng/mL: Systemic infection (sepsis) is not likely. Local bacterial infection is possible. REPEATED TO VERIFY (NOTE)       Sepsis PCT Algorithm           Lower Respiratory Tract                                      Infection PCT Algorithm    ----------------------------     ----------------------------         PCT < 0.25 ng/mL                PCT < 0.10 ng/mL         Strongly  encourage             Strongly discourage   discontinuation of antibiotics    initiation of antibiotics    ----------------------------     -----------------------------       PCT 0.25 - 0.50 ng/mL            PCT 0.10 - 0.25 ng/mL               OR       >80% decrease in PCT            Discourage initiation of                                            antibiotics      Encourage discontinuation           of antibiotics    ----------------------------     -----------------------------         PCT >= 0.50 ng/mL              PCT 0.26 - 0.50 ng/mL                AND       <80% decrease in PCT             Encourage initiation of                                             antibiotics       Encourage continuation           of antibiotics    ----------------------------     -----------------------------        PCT >= 0.50 ng/mL                  PCT > 0.50 ng/mL               AND         increase in PCT                  Strongly encourage                                      initiation of antibiotics    Strongly encourage escalation           of antibiotics                                     -----------------------------                                           PCT <= 0.25 ng/mL                                                 OR                                        >  80% decrease in PCT                                     Discontinue / Do not initiate                                              antibiotics   Culture, Urine     Status: None   Collection Time: 03/08/17  1:21 PM  Result Value Ref Range   Specimen Description URINE, RANDOM    Special Requests NONE    Culture NO GROWTH    Report Status 03/09/2017 FINAL   Procalcitonin     Status: None   Collection Time: 03/09/17  8:37 AM  Result Value Ref Range   Procalcitonin <0.10 ng/mL    Comment:        Interpretation: PCT (Procalcitonin) <= 0.5 ng/mL: Systemic infection (sepsis) is not likely. Local bacterial infection is possible. REPEATED TO VERIFY (NOTE)       Sepsis PCT Algorithm           Lower Respiratory Tract                                      Infection PCT Algorithm    ----------------------------     ----------------------------         PCT < 0.25 ng/mL                PCT < 0.10 ng/mL         Strongly encourage             Strongly discourage   discontinuation of antibiotics    initiation of antibiotics    ----------------------------     -----------------------------       PCT 0.25 - 0.50 ng/mL            PCT 0.10 - 0.25 ng/mL               OR       >80% decrease in PCT            Discourage initiation of                                            antibiotics      Encourage discontinuation           of antibiotics    ----------------------------     -----------------------------         PCT >= 0.50 ng/mL              PCT 0.26 - 0.50 ng/mL                AND       <80% decrease in PCT             Encourage initiation of                                             antibiotics  Encourage continuation           of antibiotics    ----------------------------     -----------------------------        PCT >= 0.50 ng/mL                  PCT > 0.50 ng/mL               AND         increase in PCT                  Strongly encourage                                      initiation of antibiotics    Strongly encourage escalation           of antibiotics                                      -----------------------------                                           PCT <= 0.25 ng/mL                                                 OR                                        > 80% decrease in PCT                                     Discontinue / Do not initiate                                             antibiotics   CBC with Differential/Platelet     Status: Abnormal   Collection Time: 03/09/17  8:37 AM  Result Value Ref Range   WBC 13.5 (H) 4.0 - 10.5 K/uL   RBC 4.81 3.87 - 5.11 MIL/uL   Hemoglobin 13.5 12.0 - 15.0 g/dL   HCT 40.4 36.0 - 46.0 %   MCV 84.0 78.0 - 100.0 fL   MCH 28.1 26.0 - 34.0 pg   MCHC 33.4 30.0 - 36.0 g/dL   RDW 14.5 11.5 - 15.5 %   Platelets 463 (H) 150 - 400 K/uL   Neutrophils Relative % 74 %   Neutro Abs 10.0 (H) 1.7 - 7.7 K/uL   Lymphocytes Relative 17 %   Lymphs Abs 2.3 0.7 - 4.0 K/uL   Monocytes Relative 6 %   Monocytes Absolute 0.9 0.1 - 1.0 K/uL   Eosinophils Relative 2 %   Eosinophils Absolute 0.2 0.0 - 0.7 K/uL   Basophils Relative 1 %   Basophils Absolute 0.1 0.0 - 0.1 K/uL  Basic metabolic panel  Status: Abnormal   Collection Time: 03/09/17  8:37 AM  Result Value Ref Range   Sodium 134 (L) 135 - 145 mmol/L   Potassium 3.6 3.5 - 5.1 mmol/L   Chloride 101 101 - 111 mmol/L   CO2 25 22 - 32 mmol/L   Glucose, Bld 137 (H) 65 - 99 mg/dL   BUN 8 6 - 20 mg/dL   Creatinine, Ser 0.78 0.44 - 1.00 mg/dL   Calcium 9.0 8.9 - 10.3 mg/dL   GFR calc non Af Amer >60 >60 mL/min   GFR calc Af Amer >60 >60 mL/min    Comment: (NOTE) The eGFR has been calculated using the CKD EPI equation. This calculation has not been validated in all clinical situations. eGFR's persistently <60 mL/min signify possible Chronic Kidney Disease.    Anion gap 8 5 - 15  Procalcitonin     Status: None   Collection Time: 03/10/17  2:40 AM  Result Value Ref Range   Procalcitonin <0.10 ng/mL    Comment:        Interpretation: PCT (Procalcitonin) <= 0.5  ng/mL: Systemic infection (sepsis) is not likely. Local bacterial infection is possible. (NOTE)       Sepsis PCT Algorithm           Lower Respiratory Tract                                      Infection PCT Algorithm    ----------------------------     ----------------------------         PCT < 0.25 ng/mL                PCT < 0.10 ng/mL         Strongly encourage             Strongly discourage   discontinuation of antibiotics    initiation of antibiotics    ----------------------------     -----------------------------       PCT 0.25 - 0.50 ng/mL            PCT 0.10 - 0.25 ng/mL               OR       >80% decrease in PCT            Discourage initiation of                                            antibiotics      Encourage discontinuation           of antibiotics    ----------------------------     -----------------------------         PCT >= 0.50 ng/mL              PCT 0.26 - 0.50 ng/mL               AND        <80% decrease in PCT             Encourage initiation of  antibiotics       Encourage continuation           of antibiotics    ----------------------------     -----------------------------        PCT >= 0.50 ng/mL                  PCT > 0.50 ng/mL               AND         increase in PCT                  Strongly encourage                                      initiation of antibiotics    Strongly encourage escalation           of antibiotics                                     -----------------------------                                           PCT <= 0.25 ng/mL                                                 OR                                        > 80% decrease in PCT                                     Discontinue / Do not initiate                                             antibiotics     Current Facility-Administered Medications  Medication Dose Route Frequency Provider Last Rate Last Dose  . 0.9 %  sodium  chloride infusion   Intravenous Continuous Alma Friendly, MD 100 mL/hr at 03/09/17 0114    . acetaminophen (TYLENOL) tablet 650 mg  650 mg Oral Q4H PRN Alma Friendly, MD   650 mg at 03/08/17 0131   Or  . acetaminophen (TYLENOL) solution 650 mg  650 mg Per Tube Q4H PRN Alma Friendly, MD       Or  . acetaminophen (TYLENOL) suppository 650 mg  650 mg Rectal Q4H PRN Alma Friendly, MD      . enoxaparin (LOVENOX) injection 40 mg  40 mg Subcutaneous Q24H Alma Friendly, MD   40 mg at 03/09/17 1752  . ondansetron (ZOFRAN) injection 4 mg  4 mg Intravenous Q6H PRN Alma Friendly, MD   4 mg at 03/10/17 0523  . senna-docusate (Senokot-S) tablet 1 tablet  1 tablet Oral QHS  PRN Alma Friendly, MD       Facility-Administered Medications Ordered in Other Encounters  Medication Dose Route Frequency Provider Last Rate Last Dose  . aspirin tablet 325 mg  325 mg Oral Daily Alma Friendly, MD        Musculoskeletal: Strength & Muscle Tone: within normal limits Gait & Station: UTA since patient was sitting in a chair. Patient leans: N/A  Psychiatric Specialty Exam: Physical Exam  Nursing note and vitals reviewed. Constitutional: She is oriented to person, place, and time. She appears well-developed and well-nourished.  HENT:  Head: Normocephalic and atraumatic.  Neck: Normal range of motion.  Respiratory: Effort normal.  Musculoskeletal: Normal range of motion.  Neurological: She is alert and oriented to person, place, and time.  Skin: No rash noted.  Psychiatric: She has a normal mood and affect. Her speech is normal and behavior is normal. Judgment and thought content normal. Cognition and memory are normal.    Review of Systems  Constitutional: Negative for chills and fever.  HENT: Positive for tinnitus.   Cardiovascular: Negative for chest pain.  Gastrointestinal: Negative for abdominal pain, constipation, diarrhea, nausea and vomiting.   Psychiatric/Behavioral: Positive for depression and memory loss. Negative for hallucinations, substance abuse and suicidal ideas. The patient is nervous/anxious and has insomnia.     Blood pressure 114/66, pulse 75, temperature 97.7 F (36.5 C), temperature source Oral, resp. rate 20, height 5' 5"  (1.651 m), weight 117.5 kg (259 lb), last menstrual period 02/25/2017, SpO2 100 %.Body mass index is 43.1 kg/m.  General Appearance: Well Groomed  Eye Contact:  Good  Speech:  Clear and Coherent and Normal Rate  Volume:  Normal  Mood:  Anxious  Affect:  Appropriate and Full Range  Thought Process:  Goal Directed and Linear  Orientation:  Full (Time, Place, and Person)  Thought Content:  Logical  Suicidal Thoughts:  No  Homicidal Thoughts:  No  Memory:  Immediate;   Good Recent;   Good Remote;   Good  Judgement:  Good  Insight:  Good  Psychomotor Activity:  Normal  Concentration:  Concentration: Good and Attention Span: Good  Recall:  Good  Fund of Knowledge:  Good  Language:  Good  Akathisia:  No  Handed:  Right  AIMS (if indicated):   N/A  Assets:  Communication Skills Desire for Improvement Financial Resources/Insurance Housing  ADL's:  Intact  Cognition:  WNL  Sleep:   Poor   Assessment:  Caitlin Glass is a 37 y.o. female who was admitted with seizure like episodes. Workup has been unremarkable and her current diagnosis is PNES. She reports worsening anxiety with panic attacks and depressive symptoms since onset of recent health stressors. She is wary about taking an antidepressant although it was strongly recommended for chronic anxiety and depressive symptoms. She would prefer to take a medication as needed for anxiety symptoms. She is agreeable to try Atarax. She has been on Klonopin and Ativan in the past but they caused side effects. They are also not recommended for treatment of chronic anxiety. She is agreeable to starting an antidepressant if her anxiety is not well  managed with Atarax.   Treatment Plan Summary: -Start Atarax 25 mg TID PRN for anxiety. Patient agrees to try Lexapro 5 mg daily if Atarax is not effective for anxiety. It is also strongly recommended due to chronic anxiety and depressive symptoms. -If patient decides to try Lexapro it can be increased to 10 mg daily after one  week.  -Start Trazodone 25 mg qhs PRN for insomnia.  -Psychiatry will sign off patient at this time. Please consult psychiatry again as needed.   Disposition: No evidence of imminent risk to self or others at present.   Patient does not meet criteria for psychiatric inpatient admission.  Faythe Dingwall, DO 03/10/2017 11:42 AM

## 2017-03-10 NOTE — Progress Notes (Signed)
  Speech Language Pathology Treatment: Cognitive-Linquistic  Patient Details Name: Caitlin Glass MRN: [id number] DOB: 05-09-1980 Today's Date: 03/10/2017 Time: 8250-5397 SLP Time Calculation (min) (ACUTE ONLY): 20 min  Assessment / Plan / Recommendation Clinical Impression  Pt was seen for cognitive treatment.  Pt sitting upright in chair, awake, alert, and pleasantly conversational.  Pt continues to endorse that she feels more "foggy" than her baseline; however, following extensive conversation with therapist pt feels this "fog" is most likely related to social stressors and being removed from her routine rather than acute cognitive dysfunction.  Pt is able to recall daily information related to her care and hospital course with mod I.  As a result, no further ST needs are recommended at this time.  SLP reviewed and reinforced memory compensatory strategies, specifically those that pt endorsed needing at baseline.  Education is complete.     HPI HPI: 37 y.o. female presenting with right-sided facial numbness, dizziness, and right-sided HA. CT, MRI, and chest X-ray negative. EEG pending. Recent admittion to [place] (12/20) with pt reported diagnosis of TIA. During outpatient neuro follow up visit, reported to have 3 small seizures. PMH significant of TBI, anxiety, and siezures.       SLP Plan  Discharge SLP treatment due to (comment)(pt at baseline for cognition )       Recommendations                   Follow up Recommendations: None Plan: Discharge SLP treatment due to (comment)(pt at baseline for cognition )       GO                Caitlin Glass, Selinda Orion 03/10/2017, 3:30 PM

## 2017-03-10 NOTE — Progress Notes (Signed)
Patient exhibiting pseudo seizure activity after I helped her to bathroom around 0530, she was saying she was nauseous I gave her Zofran she did not vomit. Will continue to monitor.

## 2017-03-10 NOTE — Progress Notes (Signed)
D/w dr. Johnsie Cancel cardiology-no role for other work-up--would rule out if not done hypothyroid use Beta-blocker  Will order tsh-starting toprol xl 12.5  Verneita Griffes, MD [facility] [phone]

## 2017-03-10 NOTE — Progress Notes (Signed)
Physical Therapy Treatment Patient Details Name: Caitlin Glass MRN: [id number] DOB: 09-Oct-1980 Today's Date: 03/10/2017    History of Present Illness 37 y.o. female presenting with right-sided facial numbness, dizziness, and right-sided HA. CT, MRI, and chest X-ray negative. EEG pending. Recent admittion to [place] (12/20) with pt reported diagnosis of TIA. During outpatient neuro follow up visit, reported to have 3 small seizures. PMH significant of TBI, anxiety, and siezures.     PT Comments    Pt mobility con't to be limited by increased HR, BP, anxiety, and fatigue. See vitals in ambulation part of note. Pt appears to have R sided weakness but can correct with v/c's. Pt very dependent on UEs during ambulation and s/p 10' becomes very red, hot, and shaky requiring a seated rest break. Pt becomes slow to respond. HR 149 during ambulation and BP 138/116 s/p walking. Acute PT to con't to follow.    Follow Up Recommendations  CIR;Supervision/Assistance - 24 hour     Equipment Recommendations  Wheelchair (measurements PT);Wheelchair cushion (measurements PT);Rolling walker with 5" wheels;3in1 (PT)    Recommendations for Other Services Rehab consult     Precautions / Restrictions Precautions Precautions: Fall Restrictions Weight Bearing Restrictions: No    Mobility  Bed Mobility Overal bed mobility: Modified Independent         Sit to supine: Modified independent (Device/Increase time)   General bed mobility comments: no physical assist to return to supine however pt with limited control during descent  Transfers Overall transfer level: Needs assistance Equipment used: Rolling walker (2 wheeled) Transfers: Sit to/from Stand Sit to Stand: Min assist         General transfer comment: v/c's for hand placement/push up from chair, increased time, pt with dec WBing on R UE and LE  Ambulation/Gait Ambulation/Gait assistance: Min assist;+2 safety/equipment(chair  follow) Ambulation Distance (Feet): 10 Feet(x2) Assistive device: Rolling walker (2 wheeled) Gait Pattern/deviations: Step-through pattern;Decreased stride length;Narrow base of support;Decreased dorsiflexion - right Gait velocity: slow Gait velocity interpretation: Below normal speed for age/gender General Gait Details: pt with R foot with curled toes, v/c's to relax and place flat. pt reports "my right leg doesn't feel like my leg. Like I think i stepped with it but i didn't" pt becomes very red, shaky and hot. HR increased into 140's, immeadiately returned to 110s upon sitting. Pt becomes slow to respond upon the episode of redness and shakiness. BP 140/101 s/p first amb, 138/116 s/p 2nd bout of ambulation   Stairs            Wheelchair Mobility    Modified Rankin (Stroke Patients Only)       Balance Overall balance assessment: Needs assistance Sitting-balance support: No upper extremity supported;Feet supported Sitting balance-Leahy Scale: Fair     Standing balance support: Bilateral upper extremity supported Standing balance-Leahy Scale: Poor Standing balance comment: Dizziness impacting balance                            Cognition Arousal/Alertness: Awake/alert Behavior During Therapy: WFL for tasks assessed/performed                                   General Comments: pt able to follow commands and responsive to recommendations. 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 : A Little Difficulty sitting down on and standing up from a chair with arms (e.g., wheelchair, bedside commode, etc,.)?: Unable Help needed moving to and from a bed to chair (including a wheelchair)?: A Lot Help needed walking in hospital room?: A Lot Help needed climbing 3-5 steps with a railing? : Total 6 Click Score: 13    End of Session Equipment Utilized During Treatment: Gait belt Activity Tolerance: Patient limited by fatigue Patient left: in bed;with call bell/phone within reach;with nursing/sitter in room Nurse Communication: Mobility status PT Visit Diagnosis: Unsteadiness on feet (R26.81);Difficulty in walking, not elsewhere classified (R26.2);Dizziness and giddiness (R42);Muscle weakness (generalized) (M62.81)     Time: 3419-3790 PT Time Calculation (min) (ACUTE ONLY): 24 min  Charges:  $Gait Training: 23-37 mins                    G Codes:       Kittie Plater, PT, DPT Pager #: [phone] Office #: [phone]    [place] 03/10/2017, 10:46 AM

## 2017-03-11 NOTE — Progress Notes (Signed)
Occupational Therapy Treatment Patient Details Name: Caitlin Glass MRN: [id number] DOB: 30-Mar-1980 Today's Date: 03/11/2017    History of present illness 37 y.o. female presenting with right-sided facial numbness, dizziness, and right-sided HA. CT, MRI, and chest X-ray negative. EEG pending. Recent admittion to [place] (12/20) with pt reported diagnosis of TIA. During outpatient neuro follow up visit, reported to have 3 small seizures. PMH significant of TBI, anxiety, and siezures.    OT comments  Pt progressing towards acute OT goals. Focus of session was toilet transfer and in-room functional mobility. Pt presenting with unsteadiness, more pronounced on turns. Assist level varied between close min guard and min A. Pt reporting some dizziness but unclear how pronounced or long the duration of this was. Pt reporting difficulty controlling movements of RLE during ambulation at times. D/c plan remains appropriate.   Follow Up Recommendations  CIR    Equipment Recommendations  3 in 1 bedside commode    Recommendations for Other Services Other (comment)    Precautions / Restrictions Precautions Precautions: Fall       Mobility Bed Mobility Overal bed mobility: Modified Independent Bed Mobility: Rolling;Supine to Sit Rolling: Modified independent (Device/Increase time)   Supine to sit: Modified independent (Device/Increase time)     General bed mobility comments: up in recliner  Transfers Overall transfer level: Needs assistance Equipment used: Rolling walker (2 wheeled) Transfers: Sit to/from Stand Sit to Stand: Min assist         General transfer comment: Min assist to stabilize trunk for balance during transitions.     Balance Overall balance assessment: Needs assistance Sitting-balance support: Feet supported;No upper extremity supported Sitting balance-Leahy Scale: Good Sitting balance - Comments: supervision EOB, (+) trunkal sway   Standing balance support:  Bilateral upper extremity supported Standing balance-Leahy Scale: Poor Standing balance comment: anxious with dynamic task in standing.                            ADL either performed or assessed with clinical judgement   ADL Overall ADL's : Needs assistance/impaired                         Toilet Transfer: Minimal assistance;Ambulation;RW   Toileting- Clothing Manipulation and Hygiene: Minimal assistance;Sit to/from stand Toileting - Clothing Manipulation Details (indicate cue type and reason): assist to steady balance while pt completed LB clothing management. Pt appearing anxious with dynamic task in standing.     Functional mobility during ADLs: Minimal assistance;Rolling walker General ADL Comments: Mostly close min guard for functional mobility but unsteady on turns 2x with assist given to correct balance.      Vision       Perception     Praxis      Cognition Arousal/Alertness: Awake/alert Behavior During Therapy: WFL for tasks assessed/performed Overall Cognitive Status: Within Functional Limits for tasks assessed                                 General Comments: Not specifically tested. Seems WNL per general conversation.         Exercises     Shoulder Instructions       General Comments HR 88-112  during gait.     Pertinent Vitals/ Pain       Pain Assessment: No/denies pain Pain Score: 0-No pain  Home Living  Prior Functioning/Environment              Frequency  Min 2X/week        Progress Toward Goals  OT Goals(current goals can now be found in the care plan section)  Progress towards OT goals: Progressing toward goals  Acute Rehab OT Goals Patient Stated Goal: To walk OT Goal Formulation: With patient Time For Goal Achievement: 03/23/17 Potential to Achieve Goals: Good ADL Goals Pt Will Perform Lower Body Bathing: with modified  independence;sit to/from stand Pt Will Perform Lower Body Dressing: with modified independence;sit to/from stand Pt Will Transfer to Toilet: with modified independence;ambulating Pt Will Perform Toileting - Clothing Manipulation and hygiene: with modified independence;sit to/from stand Additional ADL Goal #1: Pt will verbalize understanding of relxation technique to reduce signs/symptoms of stress.  Plan Discharge plan remains appropriate    Co-evaluation                 AM-PAC PT "6 Clicks" Daily Activity     Outcome Measure   Help from another person eating meals?: None Help from another person taking care of personal grooming?: A Little Help from another person toileting, which includes using toliet, bedpan, or urinal?: A Little Help from another person bathing (including washing, rinsing, drying)?: A Little Help from another person to put on and taking off regular upper body clothing?: None Help from another person to put on and taking off regular lower body clothing?: A Little 6 Click Score: 20    End of Session Equipment Utilized During Treatment: Gait belt;Rolling walker  OT Visit Diagnosis: Unsteadiness on feet (R26.81);Muscle weakness (generalized) (M62.81);Dizziness and giddiness (R42)   Activity Tolerance Patient tolerated treatment well   Patient Left in chair;with call bell/phone within reach;with chair alarm set;with family/visitor present   Nurse Communication Mobility status        Time: 0034-9179 OT Time Calculation (min): 20 min  Charges: OT General Charges $OT Visit: 1 Visit OT Treatments $Self Care/Home Management : 8-22 mins     Hortencia Pilar 03/11/2017, 4:14 PM

## 2017-03-11 NOTE — Progress Notes (Signed)
Physical Therapy Treatment/Vestibular Assessment Patient Details Name: Caitlin Glass MRN: [id number] DOB: April 21, 1980 Today's Date: 03/11/2017    History of Present Illness 37 y.o. female presenting with right-sided facial numbness, dizziness, and right-sided HA. CT, MRI, and chest X-ray negative. EEG pending. Recent admittion to [place] (12/20) with pt reported diagnosis of TIA. During outpatient neuro follow up visit, reported to have 3 small seizures. PMH significant of TBI, anxiety, and siezures.     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  Certainly, if she has some vestibular symptoms her h/o strabismus does not help her gaze or gaze stability.  Thorough vestibular assessment completed with no consistently positive test results (see details below).  Everything makes her dizzy and she has no clear nystagmus. She might benefit from some patching and some monocular and then binocular gaze stability training as the only thing I can see is that her eyes do not work well together, but per her report this is baseline.    Of note, her mobility does not match her in-bed MMT.  She presents weaker in bed than she does with gait and her walking pattern does not match her weakness demonstrated when tested.  Her right arm/leg symptoms also changed throughout the session.  She started with a clenched, fisted right hand, and then progressed to fully WB on RW while we walked, not needing assistance to unclench hand to grasp the RW.  She started out in the bed with a relaxed foot, and then when we went to start walking the foot toes flexed under her during gait.  The hand clenching and toe curling did not match each other in occurrence (I.e. They did not happen at the same time).  She did report she has three young children, two of which are autistic which may be a stressor to her.  She admitted to always taking care of  everyone else and never having time to take care of herself.    PT will continue with supportive, encouraging care and will treat the symptoms she presents [place] with, but I do not believe this to be a true vestibular deficit.      Follow Up Recommendations  CIR;Supervision/Assistance - 24 hour     Equipment Recommendations  Wheelchair (measurements PT);Wheelchair cushion (measurements PT);Rolling walker with 5" wheels;3in1 (PT)    Recommendations for Other Services Rehab consult     Precautions / Restrictions Precautions Precautions: Fall    Mobility  Bed Mobility Overal bed mobility: Modified Independent Bed Mobility: Rolling;Supine to Sit Rolling: Modified independent (Device/Increase time)   Supine to sit: Modified independent (Device/Increase time)     General bed mobility comments: Pt able to get EOB with use of bed rail. HOB mildly elevated.  (+) sway once statically sitting and dizziness with transitions.   Transfers Overall transfer level: Needs assistance Equipment used: Rolling walker (2 wheeled) Transfers: Sit to/from Stand Sit to Stand: Min assist         General transfer comment: Min assist to stabilize trunk for balance during transitions.   Ambulation/Gait Ambulation/Gait assistance: Min assist;+2 safety/equipment(chair to follow for safety and increased gait distance) Ambulation Distance (Feet): 150 Feet Assistive device: Rolling walker (2 wheeled) Gait Pattern/deviations: Step-through pattern;Decreased dorsiflexion - right;Decreased weight shift to right;Steppage(leaning anteriorly at times) Gait velocity: decreased Gait velocity interpretation: <1.8 ft/sec, indicative of risk for recurrent falls General Gait Details: Pt needs assist  to stabilize RW, min assist at trunk for balance. R foot toes curled under and pt having difficulty progressing it forward, but it does not buckle at all, and toes were not curled in bed.  Pt leaning heavily on arms without  signs of the weakenss she demonstrated with bed level MMT of her right arm.  She had clenched fist in bed, but not with RW use. She seemed to focus more on her right leg weakness during gait and did not walk as if she was dizzy.  She did report that the flecs of color in the tile were moving around, but generally just demonstrated effortful progression on her right leg.           Balance Overall balance assessment: Needs assistance Sitting-balance support: Feet supported;No upper extremity supported Sitting balance-Leahy Scale: Good Sitting balance - Comments: supervision EOB, (+) trunkal sway   Standing balance support: Bilateral upper extremity supported Standing balance-Leahy Scale: Poor Standing balance comment: min to min guard assist for standing with RW.        03/11/17 1347  Vestibular Assessment  General Observation Pt report tinnitus, room is moving dizziness.  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  Wears glasses for seeing, no HA, no recent medication change, no recent URI, no recent antibiotic use, no recent head injury.  Has three kids two with autism.  No h/o this kind of episode before.  Feels like her right ear is "itchy/tingling" recurrent ear infections.   Symptom Behavior  Type of Dizziness Comment (imbalance, visual field is moving)  Frequency of Dizziness when up on her feet per pt report  Duration of Dizziness while up  Aggravating Factors Activity in general  Relieving Factors Closing eyes  Occulomotor Exam  Occulomotor Alignment Abnormal  Spontaneous Absent  Gaze-induced Absent  Smooth Pursuits Comment- eyes are darting  Saccades Dysmetria  Comment Pt with h/o L eye strabismus, but this is her baseline.    Vestibulo-Occular Reflex  VOR 1 Head Only (x 1 viewing) preformed both vertically and horizontally, difficulty to do with binocular vision, but intact with  monocular vision.  Vertical no worse than horizontal.   Comment HIT (+) bil, but not very accurate test as pt closed her eyes immediately with thrust every time.   Visual Acuity  Dynamic Skew test (+), but for horizontal skew, not vertical, and this is likely due to her baseline strabismus.   Auditory  Comments Pt reports right ear tinnitus, constat, and right ear gross hearing is deminished (via finger rub test)  Positional Testing  Dix-Hallpike Dix-Hallpike Right;Dix-Hallpike Left  Horizontal Canal Testing Horizontal Canal Right;Horizontal Canal Left  Dix-Hallpike Right  Dix-Hallpike Right Duration symptomatic the entire time we are in testing position, but no nystagmus  Dix-Hallpike Right Symptoms Other (comment);No nystagmus (dizzy, movement of visual field)  Dix-Hallpike Left  Dix-Hallpike Left Duration symptomatic the entire time we are in the testing position.   Dix-Hallpike Left Symptoms Other (comment);No nystagmus (movement of visual field while we are in testing positon)  Horizontal Canal Right  Horizontal Canal Right Duration pt is symptomatic, but she is symptomatic with every test  Horizontal Canal Right Symptoms Normal  Horizontal Canal Left  Horizontal Canal Left Duration pt is symptomatic  Horizontal Canal Left Symptoms Normal   03/11/2017 orthostatic vitals were preformed on 03/09/17 were negative, so not repeated during my assessment.  Cognition Arousal/Alertness: Awake/alert Behavior During Therapy: WFL for tasks assessed/performed Overall Cognitive Status: Within Functional Limits for tasks assessed                                 General Comments: Not specifically tested. Seems WNL per general conversation.          General Comments General comments (skin integrity, edema, etc.): HR 88-112  during gait.       Pertinent Vitals/Pain Pain Assessment: No/denies pain Pain Score: 0-No pain           PT Goals  (current goals can now be found in the care plan section) Acute Rehab PT Goals Patient Stated Goal: To walk Progress towards PT goals: Progressing toward goals    Frequency    Min 3X/week      PT Plan Current plan remains appropriate       AM-PAC PT "6 Clicks" Daily Activity  Outcome Measure  Difficulty turning over in bed (including adjusting bedclothes, sheets and blankets)?: None Difficulty moving from lying on back to sitting on the side of the bed? : A Little Difficulty sitting down on and standing up from a chair with arms (e.g., wheelchair, bedside commode, etc,.)?: Unable Help needed moving to and from a bed to chair (including a wheelchair)?: A Little Help needed walking in hospital room?: A Little Help needed climbing 3-5 steps with a railing? : A Lot 6 Click Score: 16    End of Session Equipment Utilized During Treatment: Gait belt Activity Tolerance: Patient limited by fatigue Patient left: in chair   PT Visit Diagnosis: Unsteadiness on feet (R26.81);Difficulty in walking, not elsewhere classified (R26.2);Dizziness and giddiness (R42);Muscle weakness (generalized) (M62.81)     Time: 1140-1230 PT Time Calculation (min) (ACUTE ONLY): 50 min  Charges:  $Gait Training: 8-22 mins $Therapeutic Activity: 38-52 mins          Ameena Vesey B. Liberal, [place], DPT [phone]            03/11/2017, 3:17 PM

## 2017-03-11 NOTE — Progress Notes (Signed)
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  Patient's of 7, bipolar, seizures admitted from neurology clinic secondary to right parietal headache shocklike in character right facial numbness and a feeling of being trapped on the right side of her body  had been discharged with a TIA on aspirin as well as Keppra-had an MRI in the emergency room with seizures and was discharged with Toradol and Reglan  Assessment & Plan:   Assessment:  The primary encounter diagnosis was Paresthesias. Diagnoses of New onset of headaches and Leukocytosis, unspecified type were also pertinent to this visit.  Not Seizure-probable BPPV, orthostatics neg-continue IV Ativan as needed-have asked Psychiatry also to see in hospital as some somatatization noted   POTS syndrome-D/w Cardiology 1/4--no further work-up-TSH wnl-added metoprolol XL 12.5-await further rehab input  Right-sided headache facial numbness?  Complex migraine-MRI CT angiogram and other studies all negative-- d/c Keppra as per Neuro--asking psych to see-would do well on CIR-await decision  Dizziness and tinnitus-no orthostatic findings, could be POTS?  Leukocytosis etiology unclear urine culture negative blood culture X 2- pro calcitonin is less than 0.10--will recheck CBC in am  Bipolar-she has not been on any PTA meds-placed on 1/4 by psychiatry on Atarax 25 tid-not decided on LExapro-Trazadone 25 also started for insomnia   DVT prophylaxis: lovenox Code Status: full Family Communication:  None Disposition Plan:  Likely CIR if agreeable   Consultants:   neuro  Procedures:   multiple  Antimicrobials:   none   Subjective:  Awake alert pleasant in nad Sitting in  bed-slept really well with trazadone-not in current distress Still some dizziness on getting up motivated to try and do more-doesn't feel she will be safe to go home just yet-has 2 special needs children that she needs to care for with no support  Objective: Vitals:   03/10/17 1753 03/10/17 2110 03/11/17 0100 03/11/17 0400  BP: 126/89 131/84 (!) 103/59 (!) 104/57  Pulse: 90 90 88 89  Resp: 20 20 18 18   Temp: 98.4 F (36.9 C) 98.1 F (36.7 C) 98 F (36.7 C) 97.8 F (36.6 C)  TempSrc: Oral Oral Oral Oral  SpO2: 98% 96% 97% 98%  Weight:      Height:        Intake/Output Summary (Last 24 hours) at 03/11/2017 0851 Last data filed at 03/10/2017 1754 Gross per 24 hour  Intake 600 ml  Output -  Net 600 ml   Filed Weights   03/06/17 1240  Weight: 117.5 kg (259 lb)    Examination: eomi ncat,CN grossly intact-uvula midline, power 5/5 No thryomegally s1s2 no mrg abd soft nt nd no rebound no guard Le not weaker moving all 4 extremities equally-smile symm, no focal deficit No rash-tattoos  Data Reviewed: I have personally reviewed following labs and imaging studies  CBC: Recent Labs  Lab 03/06/17 1358 03/06/17 1448 03/07/17 0407 03/08/17 0459 03/09/17 0837  WBC 16.3*  --  12.2* 14.6* 13.5*  NEUTROABS  --   --  8.1* 9.9* 10.0*  HGB 14.7 15.3* 13.7 14.6 13.5  HCT 43.8 45.0 41.3 42.4 40.4  MCV 83.4  --  83.9 83.3 84.0  PLT 498*  --  489* 507* 463*  Basic Metabolic Panel: Recent Labs  Lab 03/06/17 1358 03/06/17 1448 03/07/17 0407 03/08/17 1045 03/09/17 0837  NA 138 139 137 136 134*  K 3.9 3.9 3.8 3.9 3.6  CL 104 102 100* 103 101  CO2 25  --  25 25 25   GLUCOSE 90 89 85 108* 137*  BUN 8 9 10 9 8   CREATININE 0.79 0.70 0.90 0.78 0.78  CALCIUM 9.3  --  9.4 9.2 9.0   GFR: Estimated Creatinine Clearance: 124.6 mL/min (by C-G formula based on SCr of 0.78 mg/dL). Liver Function Tests: No results for input(s): AST, ALT, ALKPHOS, BILITOT, PROT, ALBUMIN in the last 168  hours. No results for input(s): LIPASE, AMYLASE in the last 168 hours. No results for input(s): AMMONIA in the last 168 hours. Coagulation Profile: No results for input(s): INR, PROTIME in the last 168 hours. Cardiac Enzymes: Recent Labs  Lab 03/07/17 2108  TROPONINI <0.03   CBG: Recent Labs  Lab 03/07/17 0940 03/07/17 2135  GLUCAP 90 85   Urine analysis:    Component Value Date/Time   COLORURINE YELLOW 03/07/2017 0314   APPEARANCEUR HAZY (A) 03/07/2017 0314   LABSPEC >1.046 (H) 03/07/2017 0314   PHURINE 5.0 03/07/2017 0314   GLUCOSEU NEGATIVE 03/07/2017 0314   HGBUR NEGATIVE 03/07/2017 0314   BILIRUBINUR NEGATIVE 03/07/2017 0314   KETONESUR NEGATIVE 03/07/2017 0314   PROTEINUR NEGATIVE 03/07/2017 0314   NITRITE NEGATIVE 03/07/2017 0314   LEUKOCYTESUR NEGATIVE 03/07/2017 0314     Radiology Studies: Reviewed images personally in health database    Scheduled Meds: . enoxaparin (LOVENOX) injection  40 mg Subcutaneous Q24H  . metoprolol succinate  12.5 mg Oral Daily  . traZODone  25 mg Oral QHS   Continuous Infusions:    LOS: 4 days    Time spent: [place], MD [facility] ([facility]   If 7PM-7AM, please contact night-coverage [email or web address] Password [email or web address] 03/11/2017, 8:51 AM

## 2017-03-12 LAB — CBC WITH DIFFERENTIAL/PLATELET
BASOS ABS: 0.1 10*3/uL (ref 0.0–0.1)
BASOS PCT: 1 %
EOS ABS: 0.5 10*3/uL (ref 0.0–0.7)
Eosinophils Relative: 3 %
HCT: 39.9 % (ref 36.0–46.0)
HEMOGLOBIN: 13.1 g/dL (ref 12.0–15.0)
Lymphocytes Relative: 28 %
Lymphs Abs: 4.4 10*3/uL — ABNORMAL HIGH (ref 0.7–4.0)
MCH: 27.5 pg (ref 26.0–34.0)
MCHC: 32.8 g/dL (ref 30.0–36.0)
MCV: 83.8 fL (ref 78.0–100.0)
MONO ABS: 1.1 10*3/uL — AB (ref 0.1–1.0)
MONOS PCT: 7 %
NEUTROS PCT: 61 %
Neutro Abs: 9.7 10*3/uL — ABNORMAL HIGH (ref 1.7–7.7)
Platelets: 481 10*3/uL — ABNORMAL HIGH (ref 150–400)
RBC: 4.76 MIL/uL (ref 3.87–5.11)
RDW: 14.4 % (ref 11.5–15.5)
WBC: 15.9 10*3/uL — ABNORMAL HIGH (ref 4.0–10.5)

## 2017-03-12 LAB — CULTURE, BLOOD (ROUTINE X 2)
CULTURE: NO GROWTH
Culture: NO GROWTH
SPECIAL REQUESTS: ADEQUATE
Special Requests: ADEQUATE

## 2017-03-12 LAB — BASIC METABOLIC PANEL
Anion gap: 6 (ref 5–15)
BUN: 14 mg/dL (ref 6–20)
CALCIUM: 9.4 mg/dL (ref 8.9–10.3)
CO2: 27 mmol/L (ref 22–32)
Chloride: 100 mmol/L — ABNORMAL LOW (ref 101–111)
Creatinine, Ser: 0.85 mg/dL (ref 0.44–1.00)
Glucose, Bld: 104 mg/dL — ABNORMAL HIGH (ref 65–99)
Potassium: 3.4 mmol/L — ABNORMAL LOW (ref 3.5–5.1)
SODIUM: 133 mmol/L — AB (ref 135–145)

## 2017-03-12 MED ORDER — MECLIZINE HCL 12.5 MG PO TABS: 25.0000 mg | ORAL_TABLET | Freq: Three times a day (TID) | ORAL | Status: DC | PRN

## 2017-03-12 MED ORDER — MECLIZINE HCL 12.5 MG PO TABS
25.0000 mg | ORAL_TABLET | Freq: Three times a day (TID) | ORAL | Status: DC
  Administered 2017-03-12 – 2017-03-13 (×3): 25 mg via ORAL
  Filled 2017-03-12 (×3): qty 2

## 2017-03-12 NOTE — Progress Notes (Signed)
Hospitalist progress note         Caitlin Glass  DTO:[id number] DOB: 06/08/1980 DOA: 03/06/2017 PCP: [facility]   Specialists:   Brief Narrative:  37 year old female prior TBI secondary to concussion when the plantar fell on her head and knocked her out resulting in left IE Citro p.m.  Patient's of 7, bipolar, seizures admitted from neurology clinic secondary to right parietal headache shocklike in character right facial numbness and a feeling of being trapped on the right side of her body had been discharged with a TIA on aspirin as well as Keppra-had an MRI in the emergency room with seizures and was discharged with Toradol and Reglan  Assessment & Plan:   Assessment:  The primary encounter diagnosis was Paresthesias. Diagnoses of New onset of headaches and Leukocytosis, unspecified type were also pertinent to this visit.  Not Seizure-probable BPPV, orthostatics neg-continue IV Ativan as needed-started Atarax, Sleep meds trazadone 25 Adding back meclizine 25 tid to see if would make an overall diferrence to symptoms at this time  POTS syndrome-D/w Cardiology 1/4--no further work-up-TSH wnl-added metoprolol XL 12.5-await further rehab input  Right-sided headache facial numbness?  Complex migraine-MRI CT angiogram and other studies all negative-- d/c Keppra as per Neuro--would do well on CIR-await decision  Dizziness and tinnitus-no orthostatic findings, could be POTS?  Leukocytosis etiology unclear urine culture negative blood culture X 2- pro calcitonin is less than 0.10--will recheck CBC in am  Bipolar-she has not been on any PTA meds-placed on 1/4 by psychiatry on Atarax 25 tid-not decided on LExapro-Trazadone 25 also started for insomnia   DVT prophylaxis: lovenox Code Status: full Family Communication:  None Disposition Plan:  Likely CIR if agreeable   Consultants:   neuro  Procedures:   multiple  Antimicrobials:   none   Subjective:  Asking  about options other than CIR Still a little dizzy-note that therapy states exam was inconsistent with movement.  Will have them re-assess No distress currently   Objective: Vitals:   03/12/17 0023 03/12/17 0509 03/12/17 0933 03/12/17 1356  BP: 129/70 128/67 120/72 109/62  Pulse: 89 85 80 91  Resp: 18 18 18 18   Temp: 98.1 F (36.7 C) 98 F (36.7 C) 98.6 F (37 C) 98.6 F (37 C)  TempSrc: Rectal Oral Oral Oral  SpO2: 98% 99% 100% 97%  Weight:      Height:       No intake or output data in the 24 hours ending 03/12/17 1430 Filed Weights   03/06/17 1240  Weight: 117.5 kg (259 lb)    Examination: eomi ncat,CN grossly intact-uvula midline, power 5/5, some strabismus s1s2 no mrg abd soft nt nd no rebound no guard moving all 4 extremities equally-smile symm, no focal deficit No rash-tattoos  Data Reviewed: I have personally reviewed following labs and imaging studies  CBC: Recent Labs  Lab 03/06/17 1358 03/06/17 1448 03/07/17 0407 03/08/17 0459 03/09/17 0837 03/12/17 0324  WBC 16.3*  --  12.2* 14.6* 13.5* 15.9*  NEUTROABS  --   --  8.1* 9.9* 10.0* 9.7*  HGB 14.7 15.3* 13.7 14.6 13.5 13.1  HCT 43.8 45.0 41.3 42.4 40.4 39.9  MCV 83.4  --  83.9 83.3 84.0 83.8  PLT 498*  --  489* 507* 463* 983*   Basic Metabolic Panel: Recent Labs  Lab 03/06/17 1358 03/06/17 1448 03/07/17 0407 03/08/17 1045 03/09/17 0837 03/12/17 0324  NA 138 139 137 136 134* 133*  K 3.9 3.9 3.8 3.9  3.6 3.4*  CL 104 102 100* 103 101 100*  CO2 25  --  25 25 25 27   GLUCOSE 90 89 85 108* 137* 104*  BUN 8 9 10 9 8 14   CREATININE 0.79 0.70 0.90 0.78 0.78 0.85  CALCIUM 9.3  --  9.4 9.2 9.0 9.4   GFR: Estimated Creatinine Clearance: 117.3 mL/min (by C-G formula based on SCr of 0.85 mg/dL). Liver Function Tests: No results for input(s): AST, ALT, ALKPHOS, BILITOT, PROT, ALBUMIN in the last 168 hours. No results for input(s): LIPASE, AMYLASE in the last 168 hours. No results for input(s): AMMONIA  in the last 168 hours. Coagulation Profile: No results for input(s): INR, PROTIME in the last 168 hours. Cardiac Enzymes: Recent Labs  Lab 03/07/17 2108  TROPONINI <0.03   CBG: Recent Labs  Lab 03/07/17 0940 03/07/17 2135  GLUCAP 90 85   Urine analysis:    Component Value Date/Time   COLORURINE YELLOW 03/07/2017 0314   APPEARANCEUR HAZY (A) 03/07/2017 0314   LABSPEC >1.046 (H) 03/07/2017 0314   PHURINE 5.0 03/07/2017 0314   GLUCOSEU NEGATIVE 03/07/2017 0314   HGBUR NEGATIVE 03/07/2017 0314   BILIRUBINUR NEGATIVE 03/07/2017 0314   KETONESUR NEGATIVE 03/07/2017 0314   PROTEINUR NEGATIVE 03/07/2017 0314   NITRITE NEGATIVE 03/07/2017 0314   LEUKOCYTESUR NEGATIVE 03/07/2017 0314     Radiology Studies: Reviewed images personally in health database    Scheduled Meds: . enoxaparin (LOVENOX) injection  40 mg Subcutaneous Q24H  . metoprolol succinate  12.5 mg Oral Daily  . traZODone  25 mg Oral QHS   Continuous Infusions:    LOS: 5 days    Time spent: [place], MD [facility] ([facility]   If 7PM-7AM, please contact night-coverage [email or web address] Password [email or web address] 03/12/2017, 2:30 PM

## 2017-03-13 MED ORDER — HYDROXYZINE HCL 25 MG PO TABS: 25.0000 mg | ORAL_TABLET | Freq: Three times a day (TID) | ORAL | 1 refills | Status: AC | PRN

## 2017-03-13 MED ORDER — MECLIZINE HCL 25 MG PO TABS: 25.0000 mg | ORAL_TABLET | Freq: Three times a day (TID) | ORAL | 1 refills | Status: AC

## 2017-03-13 MED ORDER — TRAZODONE HCL 50 MG PO TABS: 25.0000 mg | ORAL_TABLET | Freq: Every day | ORAL | 0 refills | Status: AC

## 2017-03-13 MED ORDER — METOPROLOL SUCCINATE ER 25 MG PO TB24: 12.5000 mg | ORAL_TABLET | Freq: Every day | ORAL | 0 refills | Status: AC

## 2017-03-13 NOTE — Plan of Care (Signed)
  Progressing Education: Knowledge of General Education information will improve 03/13/2017 1106 - Progressing by Harlin Heys, RN Health Behavior/Discharge Planning: Ability to manage health-related needs will improve 03/13/2017 1106 - Progressing by Harlin Heys, RN Activity: Risk for activity intolerance will decrease 03/13/2017 1106 - Progressing by Harlin Heys, RN Elimination: Will not experience complications related to bowel motility 03/13/2017 1106 - Progressing by Harlin Heys, RN Will not experience complications related to urinary retention 03/13/2017 1106 - Progressing by Harlin Heys, RN Pain Managment: General experience of comfort will improve 03/13/2017 1106 - Progressing by Harlin Heys, RN Safety: Ability to remain free from injury will improve 03/13/2017 1106 - Progressing by Harlin Heys, RN Skin Integrity: Risk for impaired skin integrity will decrease 03/13/2017 1106 - Progressing by Harlin Heys, RN Education: Expressions of having a comfortable level of knowledge regarding the disease process will increase 03/13/2017 1106 - Progressing by Harlin Heys, RN Coping: Ability to adjust to condition or change in health will improve 03/13/2017 1106 - Progressing by Harlin Heys, RN Ability to identify appropriate support needs will improve 03/13/2017 1106 - Progressing by Harlin Heys, [place] Behavior/Discharge Planning: Compliance with prescribed medication regimen will improve 03/13/2017 1106 - Progressing by Harlin Heys, RN Medication: Risk for medication side effects will decrease 03/13/2017 1106 - Progressing by Harlin Heys, RN Clinical Measurements: Complications related to the disease process, condition or treatment will be avoided or minimized 03/13/2017 1106 - Progressing by Harlin Heys, RN Diagnostic test results will improve 03/13/2017 1106 - Progressing by Harlin Heys, RN Safety: Verbalization of understanding the  information provided will improve 03/13/2017 1106 - Progressing by Harlin Heys, RN Self-Concept: Level of anxiety will decrease 03/13/2017 1106 - Progressing by Harlin Heys, RN Ability to verbalize feelings about condition will improve 03/13/2017 1106 - Progressing by Harlin Heys, RN Education: Knowledge of disease or condition will improve 03/13/2017 1106 - Progressing by Harlin Heys, RN Knowledge of secondary prevention will improve 03/13/2017 1106 - Progressing by Harlin Heys, RN Knowledge of patient specific risk factors addressed and post discharge goals established will improve 03/13/2017 1106 - Progressing by Harlin Heys, RN Coping: Will verbalize positive feelings about self 03/13/2017 1106 - Progressing by Harlin Heys, RN Will identify appropriate support needs 03/13/2017 1106 - Progressing by Harlin Heys, [place] Behavior/Discharge Planning: Ability to manage health-related needs will improve 03/13/2017 1106 - Progressing by Harlin Heys, RN Self-Care: Ability to participate in self-care as condition permits will improve 03/13/2017 1106 - Progressing by Debe Coder, Janiesha Diehl, RN Verbalization of feelings and concerns over difficulty with self-care will improve 03/13/2017 1106 - Progressing by Harlin Heys, RN Intracerebral Hemorrhage Tissue Perfusion: Complications of Intracerebral Hemorrhage will be minimized 03/13/2017 1106 - Progressing by Harlin Heys, RN Ischemic Stroke/TIA Tissue Perfusion: Complications of ischemic stroke/TIA will be minimized 03/13/2017 1106 - Progressing by Harlin Heys, RN

## 2017-03-13 NOTE — Discharge Summary (Signed)
Physician Discharge Summary  Caitlin Glass JQB:[id number] DOB: [birth year] DOA: 03/06/2017  PCP: [facility] [facility]  Admit date: 03/06/2017 Discharge date: 03/13/2017  Time spent: 25 minutes  Recommendations for Outpatient Follow-up:  1. started Trazadone and atarax this admit for anxiety insomnia-also metoprolol and refilled meclizine 2. Needs bmet /cbc 1 wk and OP therapy ordered for patient 3. 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  Patient's admitted from neurology clinic secondary to right parietal headache shocklike in character right facial numbness and a feeling of being trapped on the right side of her body had been discharged with a TIA on aspirin as well as Keppra-Recent extensive work-up Oval Linsey fr CVA See work-up below   Hospital Course:   Not Seizure-probable BPPV, orthostatics neg-continue IV Ativan as needed-started Atarax, Sleep meds trazadone 25 Adding back meclizine 25 tid and seemd much better Left in follow-up ENT prn follow up info f she wishes to see them  POTS syndrome-D/w Cardiology 1/4--no further work-up-TSH wnl-added metoprolol XL 12.5-going home with [facility]  Right-sided headache facial numbness?  Complex migraine-MRI CT angiogram and other studies all negative-- d/c Keppra as per Neuro--would do well on CIR-await decision  Dizziness and tinnitus-no orthostatic findings, could be POTS? ENt info already in for follow  Leukocytosis etiology unclear urine culture  negative blood culture X 2- pro calcitonin is less than 0.10--will recheck CBC in am  Bipolar-she has not been on any PTA meds-placed on 1/4 by psychiatry on Atarax 25 tid-not decided on LExapro-Trazadone 25 also started for insomnia   Consultations:  Neurology  Discharge Exam: Vitals:   03/13/17 0921 03/13/17 1338  BP: 116/72 137/81  Pulse: 88 89  Resp: 20 20  Temp: 97.9 F (36.6 C) 97.7 F (36.5 C)  SpO2: 99% 97%    General: awake alet intact no deficit did much better on shecueld meclizine and ambulatory Cardiovascular:  s1 s 2no m/r/g, RRR Respiratory: clear no added sound abd soft nt nd no rebound  Discharge Instructions   Discharge Instructions    Diet - low sodium heart healthy   Complete by:  As directed    Diet - low sodium heart healthy   Complete by:  As directed    Increase activity slowly   Complete by:  As directed    Increase activity slowly   Complete by:  As directed      Allergies as of 03/13/2017      Reactions   Lidocaine Hives   Tramadol    seizures       Medication List    STOP taking these medications   amoxicillin 875 MG tablet Commonly known as:  AMOXIL   levETIRAcetam 500 MG tablet Commonly known as:  KEPPRA     TAKE these medications   acetaminophen 325 MG tablet Commonly known as:  TYLENOL Take 650-875 mg by mouth every 6 (six) hours as needed for mild pain.   aspirin 325 MG tablet Take 325 mg by mouth daily.   hydrOXYzine 25 MG tablet  Commonly known as:  ATARAX/VISTARIL Take 1 tablet (25 mg total) by mouth 3 (three) times daily as needed for anxiety.   meclizine 25 MG tablet Commonly known as:  ANTIVERT Take 1 tablet (25 mg total) by mouth 3 (three) times daily. What changed:    when to take this  reasons to take this   metoprolol succinate 25 MG 24 hr tablet Commonly known as:  TOPROL-XL Take 0.5 tablets (12.5 mg total) by mouth daily. Start taking on:  03/14/2017   traZODone 50 MG tablet Commonly known  as:  DESYREL Take 0.5 tablets (25 mg total) by mouth at bedtime.            Durable Medical Equipment  (From admission, onward)        Start     Ordered   03/13/17 1326  For home use only DME Cane  Once     03/13/17 1325     Allergies  Allergen Reactions  . Lidocaine Hives  . Tramadol     seizures       The results of significant diagnostics from this hospitalization (including imaging, microbiology, ancillary and laboratory) are listed below for reference.    Significant Diagnostic Studies: Ct Angio Head W Or Wo Contrast  Result Date: 03/06/2017 CLINICAL DATA:  Initial evaluation for acute severe right-sided headache. EXAM: CT ANGIOGRAPHY HEAD AND NECK TECHNIQUE: Multidetector CT imaging of the head and neck was performed using the standard protocol during bolus administration of intravenous contrast. Multiplanar CT image reconstructions and MIPs were obtained to evaluate the vascular anatomy. Carotid stenosis measurements (when applicable) are obtained utilizing NASCET criteria, using the distal internal carotid diameter as the denominator. CONTRAST:  42mL ISOVUE-370 IOPAMIDOL (ISOVUE-370) INJECTION 76% COMPARISON:  None. FINDINGS: CT HEAD FINDINGS Brain: Cerebral volume within normal limits for patient age. No evidence for acute intracranial hemorrhage. No findings to suggest acute large vessel territory infarct. No mass lesion, midline shift, or mass effect. Ventricles are normal in size without evidence for hydrocephalus. No extra-axial fluid collection identified. Vascular: No hyperdense vessel identified. Skull: Scalp soft tissues demonstrate no acute abnormality.Calvarium intact. Sinuses/Orbits: Globes and orbital soft tissues are within normal limits. Visualized paranasal sinuses are clear. No mastoid effusion. CTA NECK FINDINGS Aortic arch: Visualized aortic arch of normal caliber with normal branch pattern. No film rings as great vessels. Visualized subclavian arteries  widely patent. Right carotid system: Right common and internal carotid arteries widely patent without stenosis, dissection, or occlusion. Left carotid system: Left common and internal carotid arteries widely patent without stenosis, dissection, or occlusion. Vertebral arteries: Both of the vertebral artery is arise from the subclavian arteries. Vertebral arteries widely patent without stenosis, dissection, or occlusion. Skeleton: No acute osseous abnormality. No worrisome lytic or blastic osseous lesions. Other neck: No acute soft tissue abnormality within the neck. Salivary glands normal. Thyroid normal. No adenopathy. Upper chest: Visualized upper chest within normal limits. Partially visualized lungs are clear. Review of the MIP images confirms the above findings CTA HEAD FINDINGS Anterior circulation: Internal carotid artery is widely patent to the termini without flow-limiting stenosis. A1 segments widely patent. Right A1 segment fenestrated distally. Normal anterior communicating artery. Anterior cerebral artery is widely patent to their distal aspects without stenosis. M1 segments widely patent bilaterally. Normal MCA bifurcations. No proximal M2 occlusion. Distal MCA branches well perfused and symmetric. Posterior circulation: Vertebral artery widely patent to the vertebrobasilar junction. Right vertebral artery slightly dominant. Patent left PICA. Right PICA not visualized. Dominant right AICA.  Basilar artery widely patent to its distal aspect. Superior cerebral arteries patent bilaterally. Both of the posterior cerebral arteries primarily supplied via the basilar and are well perfused to their distal aspects without stenosis. Small left posterior communicating artery noted. Venous sinuses: Not well evaluated due to arterial timing of the contrast bolus. Anatomic variants: None significant. No aneurysm or vascular malformation. Delayed phase: No abnormal enhancement. Review of the MIP images confirms the  above findings IMPRESSION: Normal CTA of the head and neck. No acute intracranial abnormality identified. Electronically Signed   By: Jeannine Boga M.D.   On: 03/06/2017 17:30   Ct Angio Neck W Or Wo Contrast  Result Date: 03/06/2017 CLINICAL DATA:  Initial evaluation for acute severe right-sided headache. EXAM: CT ANGIOGRAPHY HEAD AND NECK TECHNIQUE: Multidetector CT imaging of the head and neck was performed using the standard protocol during bolus administration of intravenous contrast. Multiplanar CT image reconstructions and MIPs were obtained to evaluate the vascular anatomy. Carotid stenosis measurements (when applicable) are obtained utilizing NASCET criteria, using the distal internal carotid diameter as the denominator. CONTRAST:  41mL ISOVUE-370 IOPAMIDOL (ISOVUE-370) INJECTION 76% COMPARISON:  None. FINDINGS: CT HEAD FINDINGS Brain: Cerebral volume within normal limits for patient age. No evidence for acute intracranial hemorrhage. No findings to suggest acute large vessel territory infarct. No mass lesion, midline shift, or mass effect. Ventricles are normal in size without evidence for hydrocephalus. No extra-axial fluid collection identified. Vascular: No hyperdense vessel identified. Skull: Scalp soft tissues demonstrate no acute abnormality.Calvarium intact. Sinuses/Orbits: Globes and orbital soft tissues are within normal limits. Visualized paranasal sinuses are clear. No mastoid effusion. CTA NECK FINDINGS Aortic arch: Visualized aortic arch of normal caliber with normal branch pattern. No film rings as great vessels. Visualized subclavian arteries widely patent. Right carotid system: Right common and internal carotid arteries widely patent without stenosis, dissection, or occlusion. Left carotid system: Left common and internal carotid arteries widely patent without stenosis, dissection, or occlusion. Vertebral arteries: Both of the vertebral artery is arise from the subclavian  arteries. Vertebral arteries widely patent without stenosis, dissection, or occlusion. Skeleton: No acute osseous abnormality. No worrisome lytic or blastic osseous lesions. Other neck: No acute soft tissue abnormality within the neck. Salivary glands normal. Thyroid normal. No adenopathy. Upper chest: Visualized upper chest within normal limits. Partially visualized lungs are clear. Review of the MIP images confirms the above findings CTA HEAD FINDINGS Anterior circulation: Internal carotid artery is widely patent to the termini without flow-limiting stenosis. A1 segments widely patent. Right A1 segment fenestrated distally. Normal anterior communicating artery. Anterior cerebral artery is widely patent to their distal aspects without stenosis. M1 segments widely patent bilaterally. Normal MCA bifurcations. No proximal M2 occlusion. Distal MCA branches well perfused and symmetric. Posterior circulation: Vertebral artery widely patent to the vertebrobasilar junction. Right vertebral artery slightly dominant. Patent left PICA. Right PICA not visualized. Dominant right AICA. Basilar artery widely patent to its distal aspect. Superior cerebral arteries patent bilaterally. Both of the posterior cerebral arteries primarily supplied via the basilar and are well perfused to their distal aspects without stenosis. Small left posterior communicating artery noted. Venous sinuses: Not well evaluated due to arterial timing of the contrast bolus. Anatomic variants: None significant. No aneurysm or vascular malformation. Delayed phase: No abnormal enhancement. Review of the MIP images confirms the above findings IMPRESSION: Normal CTA of the head and neck. No acute intracranial abnormality identified. Electronically Signed   By: Jeannine Boga M.D.   On: 03/06/2017 17:30  Mr Jeri Cos Wo Contrast  Result Date: 03/06/2017 CLINICAL DATA:  Initial evaluation for acute headache, facial numbness. EXAM: MRI HEAD WITHOUT AND  WITH CONTRAST MRV HEAD WITHOUT CONTRAST TECHNIQUE: Multiplanar, multiecho pulse sequences of the brain and surrounding structures were obtained without and with intravenous contrast. Angiographic images of the intracranial venous structures were obtained using MRV technique without intravenous contrast. CONTRAST:  49mL MULTIHANCE GADOBENATE DIMEGLUMINE 529 MG/ML IV SOLN COMPARISON:  None. FINDINGS: Brain: Cerebral volume within normal limits for patient age. No focal parenchymal signal abnormality identified. No abnormal foci of restricted diffusion to suggest acute or subacute ischemia. Gray-white matter differentiation well maintained. No encephalomalacia to suggest chronic infarction. No foci of susceptibility artifact to suggest acute or chronic intracranial hemorrhage. No mass lesion, midline shift or mass effect. No hydrocephalus. No extra-axial fluid collection. Major dural sinuses are grossly patent. Pituitary gland and suprasellar region are normal. Midline structures intact and normal. No abnormal enhancement. Vascular: Major intracranial vascular flow voids well maintained and normal in appearance. Skull and upper cervical spine: Craniocervical junction normal. Visualized upper cervical spine within normal limits. Bone marrow signal intensity normal. No scalp soft tissue abnormality. Sinuses/Orbits: Globes and orbital soft tissues within normal limits. Paranasal sinuses are clear. No mastoid effusion. Inner ear structures normal. Other: Dedicated MRV images were performed. Superior sagittal sinus patent to the level of the torcula. Torcula itself is patent. Transverse and sigmoid sinuses are patent bilaterally as are the proximal internal jugular veins. Straight sinus, vein of Galen common internal cerebral veins patent. No evidence for dural sinus thrombosis. IMPRESSION: 1. Normal brain MRI.  No acute intracranial abnormality. 2. Negative MRV of the head. No evidence for dural sinus thrombosis.  Electronically Signed   By: Jeannine Boga M.D.   On: 03/06/2017 18:46   Mr Annell Greening Head  Result Date: 03/06/2017 CLINICAL DATA:  Initial evaluation for acute headache, facial numbness. EXAM: MRI HEAD WITHOUT AND WITH CONTRAST MRV HEAD WITHOUT CONTRAST TECHNIQUE: Multiplanar, multiecho pulse sequences of the brain and surrounding structures were obtained without and with intravenous contrast. Angiographic images of the intracranial venous structures were obtained using MRV technique without intravenous contrast. CONTRAST:  59mL MULTIHANCE GADOBENATE DIMEGLUMINE 529 MG/ML IV SOLN COMPARISON:  None. FINDINGS: Brain: Cerebral volume within normal limits for patient age. No focal parenchymal signal abnormality identified. No abnormal foci of restricted diffusion to suggest acute or subacute ischemia. Gray-white matter differentiation well maintained. No encephalomalacia to suggest chronic infarction. No foci of susceptibility artifact to suggest acute or chronic intracranial hemorrhage. No mass lesion, midline shift or mass effect. No hydrocephalus. No extra-axial fluid collection. Major dural sinuses are grossly patent. Pituitary gland and suprasellar region are normal. Midline structures intact and normal. No abnormal enhancement. Vascular: Major intracranial vascular flow voids well maintained and normal in appearance. Skull and upper cervical spine: Craniocervical junction normal. Visualized upper cervical spine within normal limits. Bone marrow signal intensity normal. No scalp soft tissue abnormality. Sinuses/Orbits: Globes and orbital soft tissues within normal limits. Paranasal sinuses are clear. No mastoid effusion. Inner ear structures normal. Other: Dedicated MRV images were performed. Superior sagittal sinus patent to the level of the torcula. Torcula itself is patent. Transverse and sigmoid sinuses are patent bilaterally as are the proximal internal jugular veins. Straight sinus, vein of Galen  common internal cerebral veins patent. No evidence for dural sinus thrombosis. IMPRESSION: 1. Normal brain MRI.  No acute intracranial abnormality. 2. Negative MRV of the head. No evidence for dural sinus thrombosis. Electronically  Signed   By: Jeannine Boga M.D.   On: 03/06/2017 18:46   Dg Chest Port 1 View  Result Date: 03/06/2017 CLINICAL DATA:  Initial evaluation for leukocytosis, right-sided numbness. EXAM: PORTABLE CHEST 1 VIEW COMPARISON:  Prior radiograph from 07/10/2016. FINDINGS: The cardiac and mediastinal silhouettes are stable in size and contour, and remain within normal limits. The lungs are normally inflated. No airspace consolidation, pleural effusion, or pulmonary edema is identified. There is no pneumothorax. No acute osseous abnormality identified. IMPRESSION: No active cardiopulmonary disease. Electronically Signed   By: Jeannine Boga M.D.   On: 03/06/2017 23:54    Microbiology: Recent Results (from the past 240 hour(s))  Culture, blood (routine x 2)     Status: None   Collection Time: 03/07/17  9:44 AM  Result Value Ref Range Status   Specimen Description BLOOD RIGHT FOREARM  Final   Special Requests   Final    BOTTLES DRAWN AEROBIC AND ANAEROBIC Blood Culture adequate volume   Culture NO GROWTH 5 DAYS  Final   Report Status 03/12/2017 FINAL  Final  Culture, blood (routine x 2)     Status: None   Collection Time: 03/07/17  9:47 AM  Result Value Ref Range Status   Specimen Description BLOOD LEFT WRIST  Final   Special Requests   Final    BOTTLES DRAWN AEROBIC AND ANAEROBIC Blood Culture adequate volume   Culture NO GROWTH 5 DAYS  Final   Report Status 03/12/2017 FINAL  Final  Culture, Urine     Status: None   Collection Time: 03/08/17  1:21 PM  Result Value Ref Range Status   Specimen Description URINE, RANDOM  Final   Special Requests NONE  Final   Culture NO GROWTH  Final   Report Status 03/09/2017 FINAL  Final     Labs: Basic Metabolic  Panel: Recent Labs  Lab 03/06/17 1358 03/06/17 1448 03/07/17 0407 03/08/17 1045 03/09/17 0837 03/12/17 0324  NA 138 139 137 136 134* 133*  K 3.9 3.9 3.8 3.9 3.6 3.4*  CL 104 102 100* 103 101 100*  CO2 25  --  25 25 25 27   GLUCOSE 90 89 85 108* 137* 104*  BUN 8 9 10 9 8 14   CREATININE 0.79 0.70 0.90 0.78 0.78 0.85  CALCIUM 9.3  --  9.4 9.2 9.0 9.4   Liver Function Tests: No results for input(s): AST, ALT, ALKPHOS, BILITOT, PROT, ALBUMIN in the last 168 hours. No results for input(s): LIPASE, AMYLASE in the last 168 hours. No results for input(s): AMMONIA in the last 168 hours. CBC: Recent Labs  Lab 03/06/17 1358 03/06/17 1448 03/07/17 0407 03/08/17 0459 03/09/17 0837 03/12/17 0324  WBC 16.3*  --  12.2* 14.6* 13.5* 15.9*  NEUTROABS  --   --  8.1* 9.9* 10.0* 9.7*  HGB 14.7 15.3* 13.7 14.6 13.5 13.1  HCT 43.8 45.0 41.3 42.4 40.4 39.9  MCV 83.4  --  83.9 83.3 84.0 83.8  PLT 498*  --  489* 507* 463* 481*   Cardiac Enzymes: Recent Labs  Lab 03/07/17 2108  TROPONINI <0.03   BNP: BNP (last 3 results) No results for input(s): BNP in the last 8760 hours.  ProBNP (last 3 results) No results for input(s): PROBNP in the last 8760 hours.  CBG: Recent Labs  Lab 03/07/17 0940 03/07/17 2135  GLUCAP 90 85       Signed:  Nita Sells MD   [facility] 03/13/2017, 1:57 PM

## 2017-03-13 NOTE — Progress Notes (Signed)
Rehab admissions - I met with patient at the bedside.  We need PT to see her to determine functional levels today.  Patient could benefit from a vestibular evaluation to address her dizzyness.  She does say that it is better with medication.  Patient tells me that she would only want a rehab stay for a couple of days.  I suspect that she will do better today with therapy and may not need inpatient rehab stay.  Call me for questions.  #[phone]

## 2017-03-13 NOTE — Care Management Note (Signed)
Case Management Note  Patient Details  Name: Caitlin Glass MRN: [id number] Date of Birth: Jun 20, 1980  Subjective/Objective:                    Action/Plan: Pt discharging home with [facility] services. CM met with the patient and provided her choice of [place] agencies. She selected [facility]. Cory with [facility] notified and accepted the referral. Pt with order for cane. Dan with [facility] notified and delivered the cane to the room. Pt's car is at [facility]. CM called [employer] and they will provide transportation for the patient to her car. Bedside RN updated.   Expected Discharge Date:  03/13/17               Expected Discharge Plan:  [place]  In-House Referral:     Discharge planning Services  CM Consult  Post Acute Care Choice:  Durable Medical Equipment, Home Health Choice offered to:  Patient  DME Arranged:  Kasandra Knudsen DME Agency:  [place] Arranged:  PT, OT [facility] Agency:  [place]  Status of Service:  Completed, signed off  If discussed at [place] of Stay Meetings, dates discussed:    Additional Comments:  Pollie Friar, RN 03/13/2017, 2:15 PM

## 2017-03-13 NOTE — Progress Notes (Signed)
Physical Therapy Treatment Patient Details Name: Caitlin Glass MRN: [id number] DOB: [birth year] Today's Date: 03/13/2017    History of Present Illness 37 y.o. female presenting with right-sided facial numbness, dizziness, and right-sided HA. CT, MRI, and chest X-ray negative. EEG pending. Recent admittion to [facility] (12/20) with pt reported diagnosis of TIA. During outpatient neuro follow up visit, reported to have 3 small seizures. PMH significant of TBI, anxiety, and siezures.     PT Comments    I had a long discussion with pt re: CIR vs going home.  She is moving well enough by herself to go home, the difficulty is she has three young children who rely on her and people watching them who would leave, despite her current physical condition, and not help as much as now when she is in the hospital.  We attempted gait with RW and cane (she is safer with RW) and pt chose cane as her device of choice, but reports she can also borrow her mother's RW as well.  She really wants to go home and, I agree, would likely be forced into doing more actively at home.  She continues with inconsistent physical presentation.  HEP program given to help with her with her gaze stability.    Follow Up Recommendations  Home health PT;Supervision - Intermittent     Equipment Recommendations  Cane    Recommendations for Other Services   NA     Precautions / Restrictions Precautions Precautions: Fall    Mobility  Bed Mobility Overal bed mobility: Modified Independent             General bed mobility comments: uses bed rail, but I don't think she needs it.   Transfers Overall transfer level: Modified independent Equipment used: Rolling walker (2 wheeled);Straight cane Transfers: Sit to/from Stand Sit to Stand: Modified independent (Device/Increase time)         General transfer comment: Pt able to get to standing with AD on her own power safely.   Ambulation/Gait Ambulation/Gait assistance:  Min assist;Min guard;Supervision Ambulation Distance (Feet): 150 Feet Assistive device: Rolling walker (2 wheeled);Straight cane Gait Pattern/deviations: Step-through pattern;Decreased dorsiflexion - right;Decreased weight shift to right Gait velocity: decreased   General Gait Details: Pt with continued R toes curled under, dragging right leg gait pattern with RW.  Pt did not want to use RW at home, so we did gait training with a cane which required min to min guard assist (walker was supervision).  I encouraged her to use RW as it is more stable and she looks more stable with it. HR up to 124 from 100 at rest during gait       Balance Overall balance assessment: Needs assistance Sitting-balance support: Feet supported;No upper extremity supported Sitting balance-Leahy Scale: Good     Standing balance support: Bilateral upper extremity supported;Single extremity supported Standing balance-Leahy Scale: Poor                              Cognition Arousal/Alertness: Awake/alert Behavior During Therapy: WFL for tasks assessed/performed Overall Cognitive Status: History of cognitive impairments - at baseline                                 General Comments: Per pt report STM deficits at baseline since her TBI 10 years ago      Exercises Other Exercises Other Exercises: x1  seated exercises both vertically and horizontally with monocular vision and binocular vision.  Pt demonstrated the exercises to me seated in the recliner chair.     General Comments General comments (skin integrity, edema, etc.): Pt continues to report dizziness with gait and with x 1 seated exercises.        Pertinent Vitals/Pain Pain Assessment: No/denies pain       Prior Function            PT Goals (current goals can now be found in the care plan section) Acute Rehab PT Goals Patient Stated Goal: to go home Progress towards PT goals: Progressing toward goals     Frequency    Min 3X/week      PT Plan Discharge plan needs to be updated       AM-PAC PT "6 Clicks" Daily Activity  Outcome Measure  Difficulty turning over in bed (including adjusting bedclothes, sheets and blankets)?: None Difficulty moving from lying on back to sitting on the side of the bed? : None Difficulty sitting down on and standing up from a chair with arms (e.g., wheelchair, bedside commode, etc,.)?: None Help needed moving to and from a bed to chair (including a wheelchair)?: A Little Help needed walking in hospital room?: A Little Help needed climbing 3-5 steps with a railing? : A Little 6 Click Score: 21    End of Session Equipment Utilized During Treatment: Gait belt Activity Tolerance: Patient limited by fatigue Patient left: in chair;with call bell/phone within reach;with chair alarm set   PT Visit Diagnosis: Unsteadiness on feet (R26.81);Difficulty in walking, not elsewhere classified (R26.2);Dizziness and giddiness (R42);Muscle weakness (generalized) (M62.81)     Time: 6599-3570 PT Time Calculation (min) (ACUTE ONLY): 58 min  Charges:  $Gait Training: 23-37 mins $Therapeutic Exercise: 8-22 mins $Self Care/Home Management: 8-22          Emili Mcloughlin B. [place], [place], DPT [phone]             03/13/2017, 2:41 PM

## 2017-03-13 NOTE — Progress Notes (Signed)
Pt discharged home. AVS reviewed and given to patient. All belongings sent with patient. Cane given to patient my case Freight forwarder. Scripts called into pharmacy. VSS. BP 137/81 (BP Location: Right Arm)   Pulse 89   Temp 97.7 F (36.5 C) (Oral)   Resp 20   Ht 5\' 5"  (1.651 m)   Wt 117.5 kg (259 lb)   LMP 02/25/2017   SpO2 97%   BMI 43.10 kg/m

## 2017-05-02 DIAGNOSIS — D72829 Elevated white blood cell count, unspecified: Secondary | ICD-10-CM | POA: Diagnosis not present

## 2017-05-02 DIAGNOSIS — D696 Thrombocytopenia, unspecified: Secondary | ICD-10-CM | POA: Diagnosis not present

## 2017-05-02 DIAGNOSIS — K625 Hemorrhage of anus and rectum: Secondary | ICD-10-CM | POA: Diagnosis not present

## 2017-05-10 ENCOUNTER — Ambulatory Visit: Payer: Self-pay | Admitting: Neurology

## 2017-05-15 ENCOUNTER — Ambulatory Visit: Payer: Medicaid Other | Admitting: Neurology

## 2017-05-16 DIAGNOSIS — D696 Thrombocytopenia, unspecified: Secondary | ICD-10-CM | POA: Diagnosis not present

## 2017-05-16 DIAGNOSIS — D509 Iron deficiency anemia, unspecified: Secondary | ICD-10-CM | POA: Diagnosis not present

## 2017-05-16 DIAGNOSIS — K523 Indeterminate colitis: Secondary | ICD-10-CM | POA: Diagnosis not present

## 2017-05-16 DIAGNOSIS — D72829 Elevated white blood cell count, unspecified: Secondary | ICD-10-CM | POA: Diagnosis not present

## 2020-07-06 ENCOUNTER — Ambulatory Visit (INDEPENDENT_AMBULATORY_CARE_PROVIDER_SITE_OTHER): Payer: Medicaid Other | Admitting: Podiatry

## 2020-07-06 DIAGNOSIS — Z5329 Procedure and treatment not carried out because of patient's decision for other reasons: Secondary | ICD-10-CM

## 2020-07-06 NOTE — Progress Notes (Signed)
   Complete physical exam  Patient: Caitlin Glass   DOB: 12/25/1998   40 y.o. Female  MRN: [id number]  Subjective:    No chief complaint on file.   Caitlin Glass is a 40 y.o. female who presents today for a complete physical exam. She reports consuming a {diet types:17450} diet. {types:19826} She generally feels {DESC; WELL/FAIRLY WELL/POORLY:18703}. She reports sleeping {DESC; WELL/FAIRLY WELL/POORLY:18703}. She {does/does not:200015} have additional problems to discuss today.    Most recent fall risk assessment:    09/01/2021   10:42 AM  Fall Risk   Falls in the past year? 0  Number falls in past yr: 0  Injury with Fall? 0  Risk for fall due to : No Fall Risks  Follow up Falls evaluation completed     Most recent depression screenings:    09/01/2021   10:42 AM 07/23/2020   10:46 AM  PHQ 2/9 Scores  PHQ - 2 Score 0 0  PHQ- 9 Score 5     {VISON DENTAL STD PSA (Optional):27386}  {History (Optional):23778}  Patient Care Team: Jessup, Joy, NP as PCP - General (Nurse Practitioner)   Outpatient Medications Prior to Visit  Medication Sig   fluticasone (FLONASE) 50 MCG/ACT nasal spray Place 2 sprays into both nostrils in the morning and at bedtime. After 7 days, reduce to once daily.   norgestimate-ethinyl estradiol (SPRINTEC 28) 0.25-35 MG-MCG tablet Take 1 tablet by mouth daily.   Nystatin POWD Apply liberally to affected area 2 times per day   spironolactone (ALDACTONE) 100 MG tablet Take 1 tablet (100 mg total) by mouth daily.   No facility-administered medications prior to visit.    ROS        Objective:     There were no vitals taken for this visit. {Vitals History (Optional):23777}  Physical Exam   No results found for any visits on 10/07/21. {Show previous labs (optional):23779}    Assessment & Plan:    Routine Health Maintenance and Physical Exam  Immunization History  Administered Date(s) Administered   DTaP 03/10/1999, 05/06/1999,  07/15/1999, 03/30/2000, 10/14/2003   Hepatitis A 08/10/2007, 08/15/2008   Hepatitis B 12/26/1998, 02/02/1999, 07/15/1999   HiB (PRP-OMP) 03/10/1999, 05/06/1999, 07/15/1999, 03/30/2000   IPV 03/10/1999, 05/06/1999, 01/03/2000, 10/14/2003   Influenza,inj,Quad PF,6+ Mos 11/15/2013   Influenza-Unspecified 02/15/2012   MMR 01/02/2001, 10/14/2003   Meningococcal Polysaccharide 08/15/2011   Pneumococcal Conjugate-13 03/30/2000   Pneumococcal-Unspecified 07/15/1999, 09/28/1999   Tdap 08/15/2011   Varicella 01/03/2000, 08/10/2007    Health Maintenance  Topic Date Due   HIV Screening  Never done   Hepatitis C Screening  Never done   INFLUENZA VACCINE  10/05/2021   PAP-Cervical Cytology Screening  10/07/2021 (Originally 12/25/2019)   PAP SMEAR-Modifier  10/07/2021 (Originally 12/25/2019)   TETANUS/TDAP  10/07/2021 (Originally 08/14/2021)   HPV VACCINES  Discontinued   COVID-19 Vaccine  Discontinued    Discussed health benefits of physical activity, and encouraged her to engage in regular exercise appropriate for her age and condition.  Problem List Items Addressed This Visit   None Visit Diagnoses     Annual physical exam    -  Primary   Cervical cancer screening       Need for Tdap vaccination          No follow-ups on file.     Joy Jessup, NP   

## 2021-05-29 ENCOUNTER — Emergency Department (HOSPITAL_COMMUNITY)
Admission: EM | Admit: 2021-05-29 | Discharge: 2021-05-29 | Disposition: A | Discharge status: Home / Self Care | Payer: Medicaid Other | Attending: Emergency Medicine | Admitting: Emergency Medicine

## 2021-05-29 ENCOUNTER — Emergency Department (HOSPITAL_COMMUNITY): Payer: Medicaid Other

## 2021-05-29 ENCOUNTER — Other Ambulatory Visit: Payer: Self-pay

## 2021-05-29 DIAGNOSIS — D72829 Elevated white blood cell count, unspecified: Secondary | ICD-10-CM | POA: Diagnosis not present

## 2021-05-29 DIAGNOSIS — F10920 Alcohol use, unspecified with intoxication, uncomplicated: Secondary | ICD-10-CM

## 2021-05-29 DIAGNOSIS — Z79899 Other long term (current) drug therapy: Secondary | ICD-10-CM | POA: Diagnosis not present

## 2021-05-29 DIAGNOSIS — Z7982 Long term (current) use of aspirin: Secondary | ICD-10-CM | POA: Insufficient documentation

## 2021-05-29 DIAGNOSIS — Y906 Blood alcohol level of 120-199 mg/100 ml: Secondary | ICD-10-CM | POA: Insufficient documentation

## 2021-05-29 DIAGNOSIS — F10120 Alcohol abuse with intoxication, uncomplicated: Secondary | ICD-10-CM | POA: Insufficient documentation

## 2021-05-29 DIAGNOSIS — R4182 Altered mental status, unspecified: Secondary | ICD-10-CM | POA: Insufficient documentation

## 2021-05-29 LAB — COMPREHENSIVE METABOLIC PANEL
ALT: 16 U/L (ref 0–44)
AST: 18 U/L (ref 15–41)
Albumin: 4.1 g/dL (ref 3.5–5.0)
Alkaline Phosphatase: 104 U/L (ref 38–126)
Anion gap: 8 (ref 5–15)
BUN: 12 mg/dL (ref 6–20)
CO2: 25 mmol/L (ref 22–32)
Calcium: 8.7 mg/dL — ABNORMAL LOW (ref 8.9–10.3)
Chloride: 104 mmol/L (ref 98–111)
Creatinine, Ser: 0.81 mg/dL (ref 0.44–1.00)
GFR, Estimated: >60 mL/min (ref >60)
Glucose, Bld: 111 mg/dL — ABNORMAL HIGH (ref 70–99)
Potassium: 3.3 mmol/L — ABNORMAL LOW (ref 3.5–5.1)
Sodium: 137 mmol/L (ref 135–145)
Total Bilirubin: 0.5 mg/dL (ref 0.3–1.2)
Total Protein: 7.6 g/dL (ref 6.5–8.1)

## 2021-05-29 LAB — I-STAT BETA HCG BLOOD, ED (MC, WL, AP ONLY): I-stat hCG, quantitative: <5 m[IU]/mL (ref <5)

## 2021-05-29 LAB — CBC WITH DIFFERENTIAL/PLATELET
Abs Immature Granulocytes: 0.08 10*3/uL — ABNORMAL HIGH (ref 0.00–0.07)
Basophils Absolute: 0.1 10*3/uL (ref 0.0–0.1)
Basophils Relative: 1 %
Eosinophils Absolute: 0.1 10*3/uL (ref 0.0–0.5)
Eosinophils Relative: 1 %
HCT: 40.9 % (ref 36.0–46.0)
Hemoglobin: 13.6 g/dL (ref 12.0–15.0)
Immature Granulocytes: 1 %
Lymphocytes Relative: 17 %
Lymphs Abs: 2.1 10*3/uL (ref 0.7–4.0)
MCH: 29.2 pg (ref 26.0–34.0)
MCHC: 33.3 g/dL (ref 30.0–36.0)
MCV: 88 fL (ref 80.0–100.0)
Monocytes Absolute: 1 10*3/uL (ref 0.1–1.0)
Monocytes Relative: 8 %
Neutro Abs: 9.2 10*3/uL — ABNORMAL HIGH (ref 1.7–7.7)
Neutrophils Relative %: 72 %
Platelets: 472 10*3/uL — ABNORMAL HIGH (ref 150–400)
RBC: 4.65 MIL/uL (ref 3.87–5.11)
RDW: 14.2 % (ref 11.5–15.5)
WBC: 12.6 10*3/uL — ABNORMAL HIGH (ref 4.0–10.5)
nRBC: 0 % (ref 0.0–0.2)

## 2021-05-29 LAB — ETHANOL: Alcohol, Ethyl (B): 179 mg/dL — ABNORMAL HIGH (ref <10)

## 2021-05-29 MED ORDER — SODIUM CHLORIDE 0.9 % IV BOLUS
1000.0000 mL | Freq: Once | INTRAVENOUS | Status: AC
  Administered 2021-05-29: 1000 mL via INTRAVENOUS
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
## 2021-06-17 ENCOUNTER — Inpatient Hospital Stay: Payer: Medicaid Other | Attending: Oncology | Admitting: Oncology

## 2021-06-17 ENCOUNTER — Other Ambulatory Visit: Payer: Self-pay | Admitting: Oncology

## 2021-06-17 ENCOUNTER — Inpatient Hospital Stay: Payer: Medicaid Other

## 2021-06-17 ENCOUNTER — Encounter: Payer: Self-pay | Admitting: Oncology

## 2021-06-17 DIAGNOSIS — D473 Essential (hemorrhagic) thrombocythemia: Secondary | ICD-10-CM

## 2021-06-17 LAB — BASIC METABOLIC PANEL
BUN: 10 (ref 4–21)
CO2: 26 — AB (ref 13–22)
Chloride: 101 (ref 99–108)
Creatinine: 0.7 (ref 0.5–1.1)
Glucose: 93
Potassium: 3.8 mEq/L (ref 3.5–5.1)
Sodium: 138 (ref 137–147)

## 2021-06-17 LAB — HEPATIC FUNCTION PANEL
ALT: 17 U/L (ref 7–35)
AST: 26 (ref 13–35)
Alkaline Phosphatase: 107 (ref 25–125)
Bilirubin, Total: 1.2

## 2021-06-17 LAB — CBC AND DIFFERENTIAL
HCT: 38 (ref 36–46)
Hemoglobin: 12.6 (ref 12.0–16.0)
Neutrophils Absolute: 7.84
Platelets: 527 10*3/uL — AB (ref 150–400)
WBC: 11.7

## 2021-06-17 LAB — COMPREHENSIVE METABOLIC PANEL
Albumin: 4.3 (ref 3.5–5.0)
Calcium: 8.7 (ref 8.7–10.7)

## 2021-06-17 LAB — CBC: RBC: 4.48 (ref 3.87–5.11)

## 2021-06-17 NOTE — Progress Notes (Signed)
?[facility]  ?[address] ?[place],  [place]  [address] ?[phone] ? ?Clinic Day:  06/17/2021 ? ?Referring physician: Maryella Shivers, MD ? ?HISTORY OF PRESENT ILLNESS:  ?The patient is a 41 y.o. female  who I was asked to consult upon for thrombocythemia.  Recent labs showed an elevated platelet count of 575.  Furthermore, her white count was elevated at 13.6.  Of note, this patient has had similar elevated blood counts 4 years ago.  At that time, JAK2 mutation testing came back negative for a myeloproliferative disorder being present.  In the past, she also had problems with colitis.  However she denies having any recent GI flareups.  The patient claims to have had a recent tick bite for which she was apparently tested positive for either [facility] spotted fever or Lyme disease.  She did take doxycycline for treatment.  As it pertains to elevated blood counts, she denies having undergone a previous splenectomy.  To her knowledge, there is also no family history of any hematologic disorders.    ? ?PAST MEDICAL HISTORY:  ? ?Past Medical History:  ?Diagnosis Date  ? Anxiety   ? Seizures ([place])   ? TBI (traumatic brain injury) [facility]) 2008  ? ? ?PAST SURGICAL HISTORY:  ? ?Past Surgical History:  ?Procedure Laterality Date  ? APPENDECTOMY    ? CHOLECYSTECTOMY    ? DILATION AND CURETTAGE OF UTERUS    ? ESOPHAGOGASTRODUODENOSCOPY ENDOSCOPY    ? ? ?CURRENT MEDICATIONS:  ? ?Current Outpatient Medications  ?Medication Sig Dispense Refill  ? acetaminophen (TYLENOL) 325 MG tablet Take 650-875 mg by mouth every 6 (six) hours as needed for mild pain.    ? aspirin 325 MG tablet Take 325 mg by mouth daily.    ? doxycycline (VIBRAMYCIN) 100 MG capsule Take 100 mg by mouth 2 (two) times daily.    ? hydrOXYzine (ATARAX/VISTARIL) 25 MG tablet Take 1 tablet (25 mg total) by mouth 3 (three) times daily as needed for anxiety. 30 tablet 1  ? meclizine (ANTIVERT) 25 MG tablet Take 1 tablet (25 mg  total) by mouth 3 (three) times daily. 75 tablet 1  ? metoprolol succinate (TOPROL-XL) 25 MG 24 hr tablet Take 0.5 tablets (12.5 mg total) by mouth daily. 30 tablet 0  ? traZODone (DESYREL) 50 MG tablet Take 0.5 tablets (25 mg total) by mouth at bedtime. 30 tablet 0  ? ?No current facility-administered medications for this visit.  ? ?Facility-Administered Medications Ordered in Other Visits  ?Medication Dose Route Frequency Provider Last Rate Last Admin  ? aspirin tablet 325 mg  325 mg Oral Daily Alma Friendly, MD      ? ? ?ALLERGIES:  ? ?Allergies  ?Allergen Reactions  ? Ketorolac   ?  Other reaction(s): Other (See Comments) ?seizures ?seizures ?  ? Levofloxacin   ?  Other reaction(s): Confusion (intolerance), Mental Status Changes (intolerance), Other (see comments) ?Other reaction(s): Mental Status Changes (intolerance) ?Vision changes ?Vision changes ?  ? Procaine Anaphylaxis  ? Lidocaine Hives  ? Nsaids   ?  Other reaction(s): Other (See Comments)  ? Tilactase   ?  Other reaction(s): Other (See Comments) ?Sensitive to dairy in large quantities ?Sensitive to dairy in large quantities ?  ? Tramadol   ?  seizures   ? Valproic Acid   ?  Other reaction(s): Other (See Comments) ?AMS ?AMS ?  ? Tape Rash  ?  Paper tape please ?Paper tape please ?  ? ? ?  FAMILY HISTORY:  ? ?Family History  ?Problem Relation Age of Onset  ? Seizures Mother   ? Barrett's esophagus Mother   ? COPD Mother   ? Diabetes Father   ? Stroke Father   ? ? ?SOCIAL HISTORY:  ?The patient was born and raised in [place].  She currently lives in [place].  She is single, with 3 children.  She is currently doing behavioral health care work.  She does use marijuana.  She also drinks socially. ? ?REVIEW OF SYSTEMS:  ?Review of Systems  ?Constitutional:  Negative for fatigue and fever.  ?HENT:   Positive for tinnitus. Negative for hearing loss and sore throat.   ?Eyes:  Negative for eye problems.  ?Respiratory:  Positive for cough and  shortness of breath. Negative for chest tightness and hemoptysis.   ?Cardiovascular:  Negative for chest pain and palpitations.  ?Gastrointestinal:  Negative for abdominal distention, abdominal pain, blood in stool, constipation, diarrhea, nausea and vomiting.  ?Endocrine: Negative for hot flashes.  ?Genitourinary:  Negative for difficulty urinating, dysuria, frequency, hematuria and nocturia.   ?Musculoskeletal:  Positive for arthralgias. Negative for back pain, gait problem and myalgias.  ?Skin: Negative.  Negative for itching and rash.  ?Neurological: Negative.  Negative for dizziness, extremity weakness, gait problem, headaches, light-headedness and numbness.  ?Hematological: Negative.   ?Psychiatric/Behavioral:  Negative for depression and suicidal ideas. The patient is nervous/anxious.    ? ?PHYSICAL EXAM:  ?Blood pressure 139/73, pulse 93, temperature 99.1 ?F (37.3 ?C), resp. rate 16, height '5\' 5"'$  (1.651 m), weight 229 lb 14.4 oz (104.3 kg), SpO2 100 %. ?Wt Readings from Last 3 Encounters:  ?06/17/21 229 lb 14.4 oz (104.3 kg)  ?03/06/17 259 lb (117.5 kg)  ?03/06/17 259 lb (117.5 kg)  ? ?Body mass index is 38.26 kg/m?[place] Kitchen ?Performance status (ECOG): 0 - Asymptomatic ?Physical Exam ?Constitutional:   ?   Appearance: Normal appearance. She is not ill-appearing.  ?HENT:  ?   Mouth/Throat:  ?   Mouth: Mucous membranes are moist.  ?   Pharynx: Oropharynx is clear. No oropharyngeal exudate or posterior oropharyngeal erythema.  ?Cardiovascular:  ?   Rate and Rhythm: Normal rate and regular rhythm.  ?   Heart sounds: No murmur heard. ?  No friction rub. No gallop.  ?Pulmonary:  ?   Effort: Pulmonary effort is normal. No respiratory distress.  ?   Breath sounds: Normal breath sounds. No wheezing, rhonchi or rales.  ?Abdominal:  ?   General: Bowel sounds are normal. There is no distension.  ?   Palpations: Abdomen is soft. There is no mass.  ?   Tenderness: There is no abdominal tenderness.  ?Musculoskeletal:     ?    General: No swelling.  ?   Right lower leg: No edema.  ?   Left lower leg: No edema.  ?Lymphadenopathy:  ?   Cervical: No cervical adenopathy.  ?   Upper Body:  ?   Right upper body: No supraclavicular or axillary adenopathy.  ?   Left upper body: No supraclavicular or axillary adenopathy.  ?   Lower Body: No right inguinal adenopathy. No left inguinal adenopathy.  ?Skin: ?   General: Skin is warm.  ?   Coloration: Skin is not jaundiced.  ?   Findings: No lesion or rash.  ?Neurological:  ?   General: No focal deficit present.  ?   Mental Status: She is alert and oriented to person, place, and time. Mental status is at  baseline.  ?Psychiatric:     ?   Mood and Affect: Mood normal.     ?   Behavior: Behavior normal.     ?   Thought Content: Thought content normal.  ? ? ?LABS:  ? ? ?  Latest Ref Rng & Units 06/17/2021  ? 12:00 AM 05/29/2021  ?  2:22 AM 03/12/2017  ?  3:24 AM  ?CBC  ?WBC  11.7      12.6   15.9    ?Hemoglobin 12.0 - 16.0 12.6      13.6   13.1    ?Hematocrit 36 - 46 38      40.9   39.9    ?Platelets 150 - 400 K/uL 527      472   481    ?  ? This result is from an external source.  ? ? ?  Latest Ref Rng & Units 06/17/2021  ? 12:00 AM 05/29/2021  ?  2:22 AM 03/12/2017  ?  3:24 AM  ?CMP  ?Glucose 70 - 99 mg/dL  111   104    ?BUN 4 - '21 10      12   14    '$ ?Creatinine 0.5 - 1.1 0.7      0.81   0.85    ?Sodium 137 - 147 138      137   133    ?Potassium 3.5 - 5.1 mEq/L 3.8      3.3   3.4    ?Chloride 99 - 108 101      104   100    ?CO2 13 - '22 26      25   27    '$ ?Calcium 8.7 - 10.7 8.7      8.7   9.4    ?Total Protein 6.5 - 8.1 g/dL  7.6     ?Total Bilirubin 0.3 - 1.2 mg/dL  0.5     ?Alkaline Phos 25 - 125 107      104     ?AST 13 - 35 26      18     ?ALT 7 - 35 U/L 17      16     ?  ? This result is from an external source.  ? ?ASSESSMENT & PLAN:  ?A 41 y.o. female who I was asked to consult upon for thrombocythemia.  Her labs today are not much different than what they have been over numerous years.  As mentioned  previously, JAK2 mutation testing came back negative for potential myeloproliferative disorder being present.   Although the patient has concerns about her recent tick bite, her peripheral counts have really not been

## 2021-06-22 DIAGNOSIS — D473 Essential (hemorrhagic) thrombocythemia: Secondary | ICD-10-CM | POA: Insufficient documentation

## 2021-10-15 ENCOUNTER — Emergency Department (HOSPITAL_COMMUNITY): Payer: Medicaid Other

## 2021-10-15 ENCOUNTER — Encounter (HOSPITAL_COMMUNITY): Payer: Self-pay

## 2021-10-15 ENCOUNTER — Emergency Department (HOSPITAL_COMMUNITY)
Admission: EM | Admit: 2021-10-15 | Discharge: 2021-10-15 | Disposition: A | Discharge status: Home / Self Care | Payer: Medicaid Other | Attending: Emergency Medicine | Admitting: Emergency Medicine

## 2021-10-15 ENCOUNTER — Other Ambulatory Visit: Payer: Self-pay

## 2021-10-15 DIAGNOSIS — Y907 Blood alcohol level of 200-239 mg/100 ml: Secondary | ICD-10-CM | POA: Insufficient documentation

## 2021-10-15 DIAGNOSIS — R4182 Altered mental status, unspecified: Secondary | ICD-10-CM | POA: Diagnosis not present

## 2021-10-15 DIAGNOSIS — F10920 Alcohol use, unspecified with intoxication, uncomplicated: Secondary | ICD-10-CM | POA: Diagnosis not present

## 2021-10-15 LAB — CBC WITH DIFFERENTIAL/PLATELET
Abs Immature Granulocytes: 0.14 10*3/uL — ABNORMAL HIGH (ref 0.00–0.07)
Basophils Absolute: 0.1 10*3/uL (ref 0.0–0.1)
Basophils Relative: 1 %
Eosinophils Absolute: 0.1 10*3/uL (ref 0.0–0.5)
Eosinophils Relative: 1 %
HCT: 39.7 % (ref 36.0–46.0)
Hemoglobin: 13.1 g/dL (ref 12.0–15.0)
Immature Granulocytes: 1 %
Lymphocytes Relative: 24 %
Lymphs Abs: 2.5 10*3/uL (ref 0.7–4.0)
MCH: 29.4 pg (ref 26.0–34.0)
MCHC: 33 g/dL (ref 30.0–36.0)
MCV: 89.2 fL (ref 80.0–100.0)
Monocytes Absolute: 0.7 10*3/uL (ref 0.1–1.0)
Monocytes Relative: 7 %
Neutro Abs: 6.6 10*3/uL (ref 1.7–7.7)
Neutrophils Relative %: 66 %
Platelets: 439 10*3/uL — ABNORMAL HIGH (ref 150–400)
RBC: 4.45 MIL/uL (ref 3.87–5.11)
RDW: 14.6 % (ref 11.5–15.5)
WBC: 10.2 10*3/uL (ref 4.0–10.5)
nRBC: 0 % (ref 0.0–0.2)

## 2021-10-15 LAB — COMPREHENSIVE METABOLIC PANEL
ALT: 12 U/L (ref 0–44)
AST: 15 U/L (ref 15–41)
Albumin: 3.6 g/dL (ref 3.5–5.0)
Alkaline Phosphatase: 85 U/L (ref 38–126)
Anion gap: 8 (ref 5–15)
BUN: 8 mg/dL (ref 6–20)
CO2: 22 mmol/L (ref 22–32)
Calcium: 8 mg/dL — ABNORMAL LOW (ref 8.9–10.3)
Chloride: 107 mmol/L (ref 98–111)
Creatinine, Ser: 0.76 mg/dL (ref 0.44–1.00)
GFR, Estimated: >60 mL/min (ref >60)
Glucose, Bld: 118 mg/dL — ABNORMAL HIGH (ref 70–99)
Potassium: 3.1 mmol/L — ABNORMAL LOW (ref 3.5–5.1)
Sodium: 137 mmol/L (ref 135–145)
Total Bilirubin: 0.6 mg/dL (ref 0.3–1.2)
Total Protein: 6.7 g/dL (ref 6.5–8.1)

## 2021-10-15 LAB — SALICYLATE LEVEL: Salicylate Lvl: <7 mg/dL — ABNORMAL LOW (ref 7.0–30.0)

## 2021-10-15 LAB — RAPID URINE DRUG SCREEN, HOSP PERFORMED
Amphetamines: POSITIVE — AB
Barbiturates: NOT DETECTED
Benzodiazepines: NOT DETECTED
Cocaine: NOT DETECTED
Opiates: NOT DETECTED
Tetrahydrocannabinol: POSITIVE — AB

## 2021-10-15 LAB — ETHANOL: Alcohol, Ethyl (B): 206 mg/dL — ABNORMAL HIGH (ref <10)

## 2021-10-15 LAB — ACETAMINOPHEN LEVEL: Acetaminophen (Tylenol), Serum: <10 ug/mL — ABNORMAL LOW (ref 10–30)

## 2021-10-15 LAB — I-STAT BETA HCG BLOOD, ED (MC, WL, AP ONLY): I-stat hCG, quantitative: <5 m[IU]/mL (ref <5)

## 2021-10-15 MED ORDER — POTASSIUM CHLORIDE CRYS ER 20 MEQ PO TBCR
40.0000 meq | EXTENDED_RELEASE_TABLET | Freq: Once | ORAL | Status: AC
  Administered 2021-10-15: 40 meq via ORAL
  Filled 2021-10-15: qty 2

## 2021-10-15 NOTE — ED Provider Notes (Signed)
[facility] Emergency Department Provider Note MRN:  [id number]  Arrival date & time: 10/15/21     Chief Complaint   Alcohol Intoxication   History of Present Illness   Caitlin Glass is a 41 y.o. year-old female presents to the ED with chief complaint of ETOH intoxication.  She was BIB EMS from the [place].  Patient noted to be vomiting by EMS.  EMS gave 500 NS and '4mg'$  Zofran.    Level 5 caveat for history and ROS due to intoxication.     Review of Systems  Pertinent review of systems noted in HPI.    Physical Exam   Vitals:   10/15/21 0600 10/15/21 0623  BP: 106/63   Pulse: 89   Resp: 16   Temp:  97.7 F (36.5 C)  SpO2: 95%     CONSTITUTIONAL:  intoxicated-appearing, NAD NEURO:  Asleep EYES:  eyes equal and reactive ENT/NECK:  Supple, no stridor  CARDIO:  normal rate, regular rhythm, appears well-perfused  PULM:  No respiratory distress, CTAB GI/GU:  non-distended,  MSK/SPINE:  No gross deformities, no edema, moves all extremities  SKIN:  no rash, atraumatic   *Additional and/or pertinent findings included in MDM below  Diagnostic and Interventional Summary    EKG Interpretation  Date/Time:    Ventricular Rate:    PR Interval:    QRS Duration:   QT Interval:    QTC Calculation:   R Axis:     Text Interpretation:         Labs Reviewed  CBC WITH DIFFERENTIAL/PLATELET - Abnormal; Notable for the following components:      Result Value   Platelets 439 (*)    Abs Immature Granulocytes 0.14 (*)    All other components within normal limits  COMPREHENSIVE METABOLIC PANEL - Abnormal; Notable for the following components:   Potassium 3.1 (*)    Glucose, Bld 118 (*)    Calcium 8.0 (*)    All other components within normal limits  ETHANOL - Abnormal; Notable for the following components:   Alcohol, Ethyl (B) 206 (*)    All other components within normal limits  RAPID URINE DRUG SCREEN, HOSP PERFORMED - Abnormal;  Notable for the following components:   Amphetamines POSITIVE (*)    Tetrahydrocannabinol POSITIVE (*)    All other components within normal limits  ACETAMINOPHEN LEVEL - Abnormal; Notable for the following components:   Acetaminophen (Tylenol), Serum <10 (*)    All other components within normal limits  SALICYLATE LEVEL - Abnormal; Notable for the following components:   Salicylate Lvl <6.1 (*)    All other components within normal limits  I-STAT BETA HCG BLOOD, ED ([facility], [facility], [facility] ONLY)  I-STAT BETA HCG BLOOD, ED ([facility], [facility], [facility] ONLY)    CT HEAD WO CONTRAST (5MM)  Final Result      Medications - No data to display   Procedures  /  Critical Care Procedures  ED Course and Medical Decision Making  I have reviewed the triage vital signs, the nursing notes, and pertinent available records from the EMR.  Social Determinants Affecting Complexity of Care: Patient has no clinically significant social determinants affecting this chief complaint..   ED Course:    Medical Decision Making Amount and/or Complexity of Data Reviewed Labs: ordered. Radiology: ordered.     Consultants: No consultations were needed in caring for this patient.   Treatment and Plan: Emergency department workup does not suggest an emergent condition requiring admission  or immediate intervention beyond  what has been performed at this time. The patient is safe for discharge and has  been instructed to return immediately for worsening symptoms, change in  symptoms or any other concerns  Patient still sobering up, but I suspect that she will be able to go in the next couple of hours once she has sobered up.    Final Clinical Impressions(s) / ED Diagnoses     ICD-10-CM   1. Alcoholic intoxication without complication [facility])  Z30.865       ED Discharge Orders     None         Discharge Instructions Discussed with and Provided to Patient:   Discharge Instructions   None      Montine Circle,  PA-C 10/15/21 [id number]    Quintella Reichert, MD 10/16/21 [phone]

## 2021-10-15 NOTE — ED Notes (Signed)
Attempted again to obtain temperature both orally and axillary without success. Triage RN aware.

## 2021-10-15 NOTE — ED Notes (Signed)
Unable to obtain temp due to pt status. Will continue to attempt. Triage RN aware.

## 2021-10-15 NOTE — ED Provider Notes (Signed)
Care of patient handed of at shift change from [place], [place]. Please see their note for evaluation and work-up. Briefly, 41 year old female who presents to the emergency department via EMS for alcohol intoxication. Per EMS patient was vomiting repeatedly. She received 538m saline, zofran PTA. Appears on chart review she has been seen multiple times for alcohol intoxication. I do not see any admissions for DT's or seizures.  Physical Exam  BP 106/63   Pulse 89   Temp 97.7 F (36.5 C)   Resp 16   SpO2 95%   Physical Exam Vitals and nursing note reviewed.  Constitutional:      General: She is not in acute distress.    Appearance: She is not ill-appearing.  HENT:     Head: Normocephalic and atraumatic.     Mouth/Throat:     Mouth: Mucous membranes are moist.     Pharynx: Oropharynx is clear.  Eyes:     General: No scleral icterus.    Extraocular Movements: Extraocular movements intact.  Cardiovascular:     Pulses: Normal pulses.  Pulmonary:     Effort: Pulmonary effort is normal. No respiratory distress.  Musculoskeletal:        General: Normal range of motion.     Cervical back: Neck supple.  Skin:    General: Skin is warm and dry.     Findings: No rash.  Neurological:     General: No focal deficit present.     Mental Status: She is alert.  Psychiatric:        Mood and Affect: Mood normal.        Behavior: Behavior normal.        Thought Content: Thought content normal.        Judgment: Judgment normal.    Procedures  Procedures  ED Course / MDM    Medical Decision Making Amount and/or Complexity of Data Reviewed Labs: ordered. Radiology: ordered.   CT head is negative for acute abnormalities Appears she does have co-ingestion with amphetamines, THC.  Once she woke up and was conversational, she does state that she vapes delta 8 with small amounts of THC in it and she does take Adderall.   Tylenol and salicylates are negative  EtOH 206  Potassium is decreased  at 3.1.  Given replacement.  Able to ambulate without assistance.  She tolerated p.o.  Feel that she is safe for discharge home with PCP follow-up.  Given return precautions     ABing Matter08/11/23 [id number]   RQuintella Reichert MD 10/16/21 0021

## 2021-10-15 NOTE — ED Notes (Signed)
Patient given discharge instructions, given opportunity to ask questions, and stated understanding. Patient ambulatory with a steady gate at this time. Patient states that her ride is on their way.

## 2021-10-15 NOTE — Discharge Instructions (Signed)
You were seen in the emergency department today for alcohol intoxication.  Please follow-up with your primary care provider.  Drink responsibly and do not drive while intoxicated.

## 2021-10-15 NOTE — ED Triage Notes (Signed)
Pt BIB Ems with reports of ETOH. Pt was picked up from [facility]. Pt kept throwing up and was vomiting, EMS was called. 18 g left ac. 500 ml ns, 4 mg zofran.

## 2021-10-15 NOTE — ED Notes (Signed)
Patient states that she has a ride coming to get her.

## 2021-10-15 NOTE — ED Notes (Signed)
Patient was able to tolerate PO intake (crackers and juice).

## 2021-10-15 NOTE — ED Notes (Signed)
Pt ambulated to restroom with no assistance. ?

## 2021-10-15 NOTE — ED Notes (Signed)
Pt ambulatory to bathroom. Pt unsteady, requires supervision for safety

## 2023-09-09 IMAGING — CT CT HEAD W/O CM
2 of 7 series · 11 of 47 positions shown, 13 images · non-contrast
Comparison: Head CT 06/15/2020

CLINICAL DATA: Mental status changes of unknown cause.



[Series 10: true axial · axial · 0.33mm/px · z∈[-176,-10]mm · 8 of 65 slices shown, 10 images]
[im 5/65  brain]
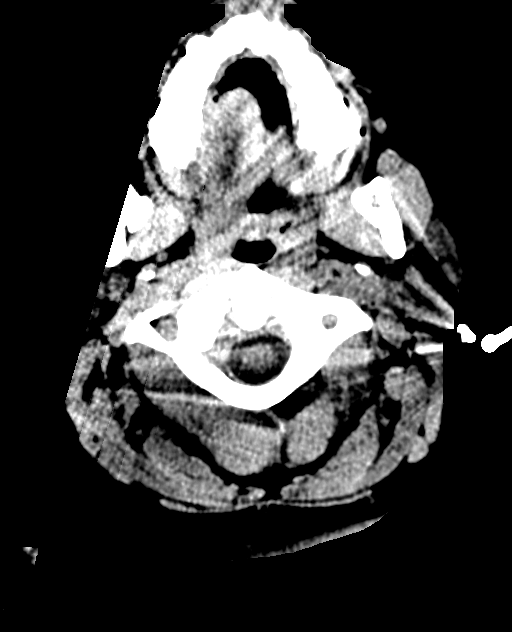
[im 5/65  bone]
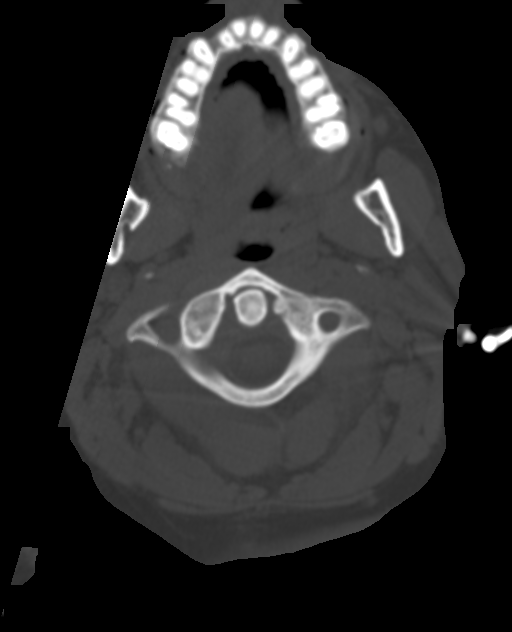
[im 13/65  brain]
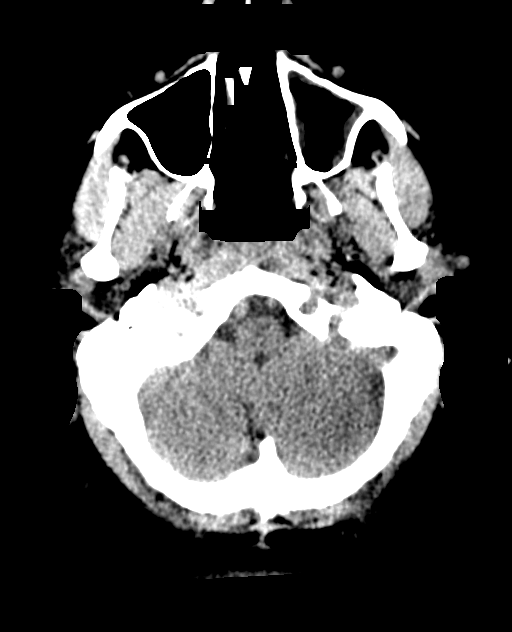
[im 21/65  brain]
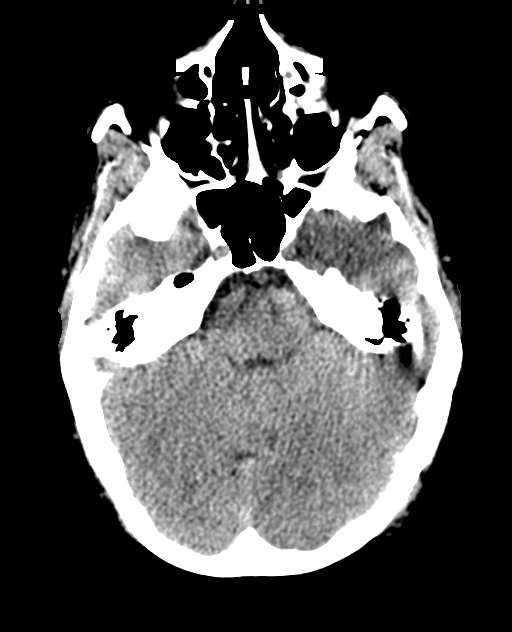
[im 29/65  brain]
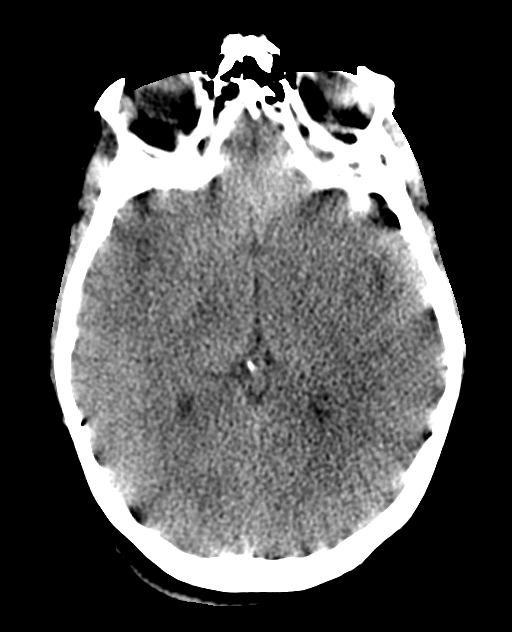
[im 37/65  brain]
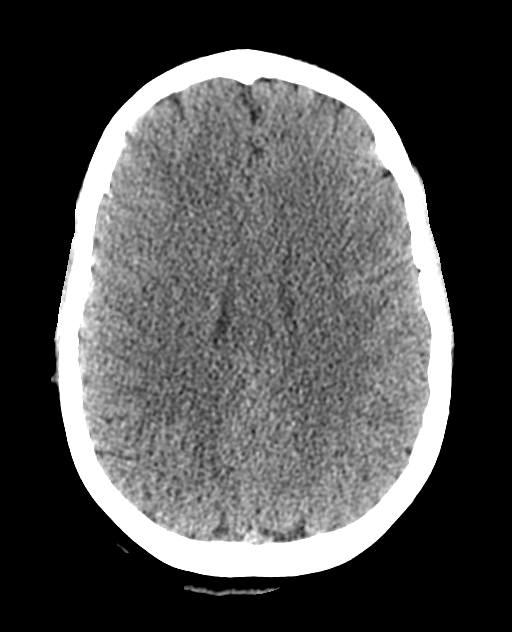
[im 37/65  bone]
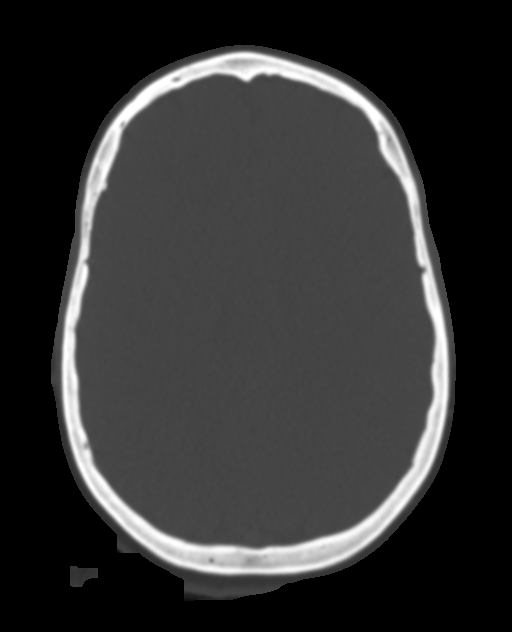
[im 45/65  brain]
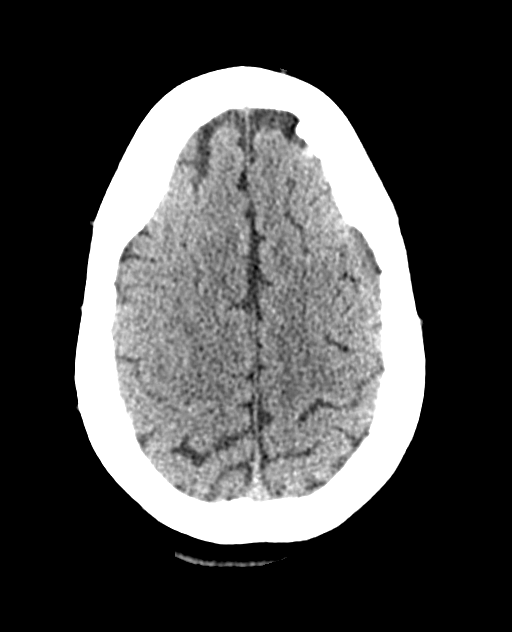
[im 53/65  brain]
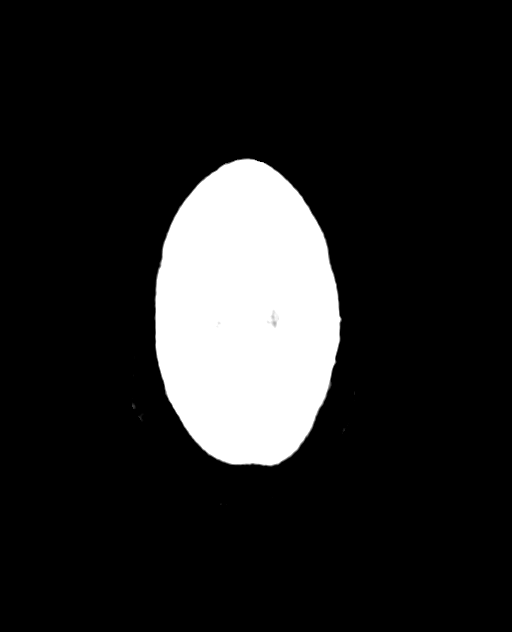
[im 61/65  brain]
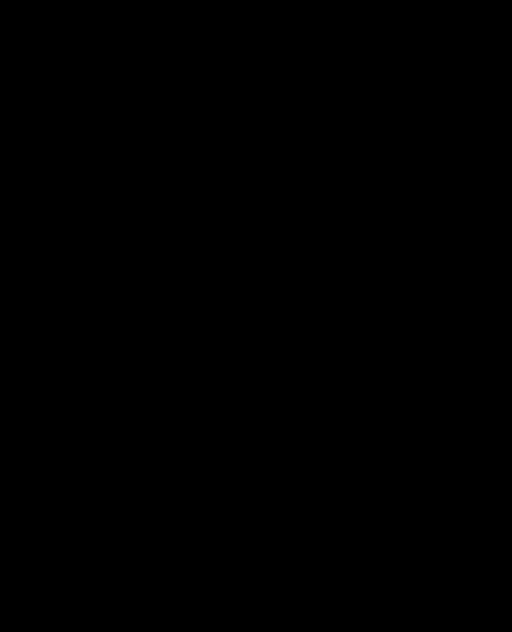

[Series 11: coronal soft tissue · coronal · 0.15mm/px · 3 of 75 slices shown]
[im 23/75  brain]
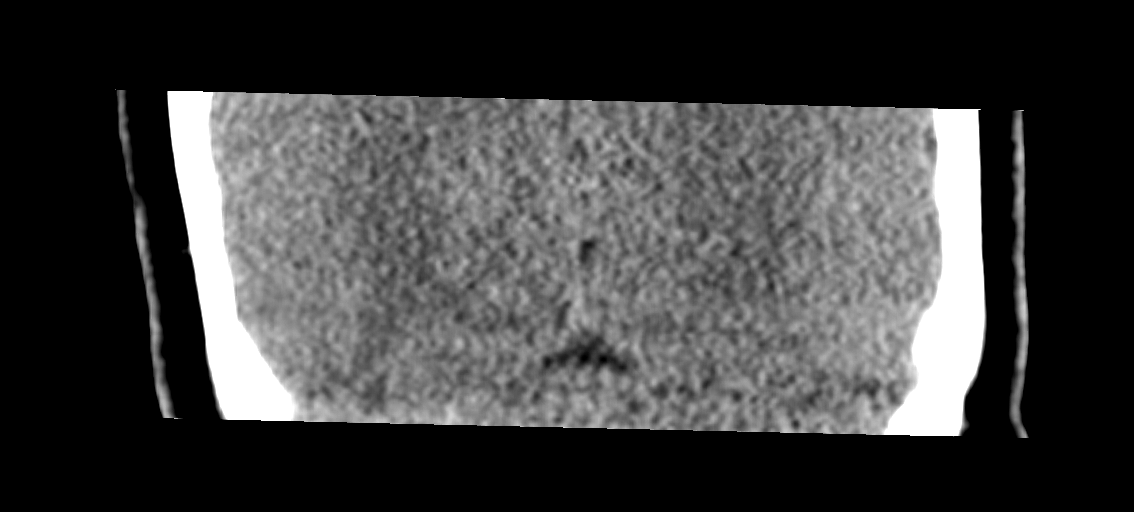
[im 36/75  brain]
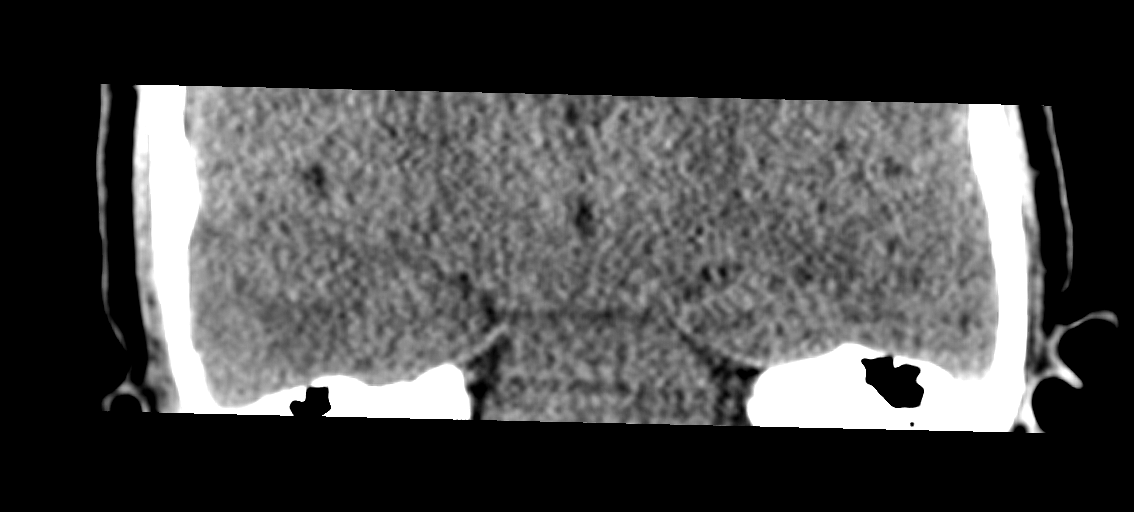
[im 49/75  brain]
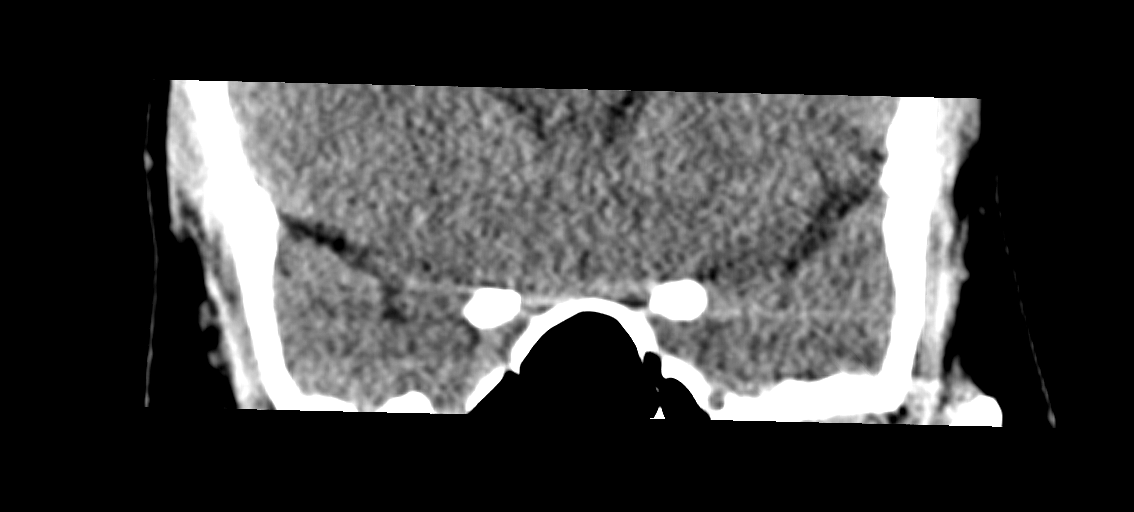

[11 of 47 positions shown; findings below may reference images not displayed]

FINDINGS: Brain: No evidence of acute infarction, hemorrhage, hydrocephalus,
extra-axial collection or mass lesion/mass effect. Extra-axial
calcification along the left frontal anterior high convexity is
unchanged.

Vascular: No hyperdense vessel or unexpected calcification.

Skull: Normal. Negative for fracture or focal lesion.

Sinuses/Orbits: There is increased membrane thickening in the
left-greater-than-right maxillary sinuses and bilateral ethmoid air
cells. The frontal and sphenoid sinus and mastoid air cells are
clear. Unremarkable orbits orbital contents.

Other: None.
IMPRESSION: No acute intracranial CT findings or interval changes.
